# Patient Record
Sex: Female | Born: 1985 | Race: Black or African American | Hispanic: No | Marital: Single | Smoking: Former smoker
Health system: Southern US, Community
[De-identification: ages and names within clinical notes are randomized; demographics above are authoritative.]

## PROBLEM LIST (undated history)

## (undated) DIAGNOSIS — F329 Major depressive disorder, single episode, unspecified: Secondary | ICD-10-CM

## (undated) DIAGNOSIS — F32A Depression, unspecified: Secondary | ICD-10-CM

## (undated) HISTORY — DX: Depression, unspecified: F32.A

## (undated) HISTORY — DX: Major depressive disorder, single episode, unspecified: F32.9

---
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
## 2004-06-20 ENCOUNTER — Emergency Department (HOSPITAL_COMMUNITY): Admission: EM | Admit: 2004-06-20 | Discharge: 2004-06-20 | Payer: Self-pay | Admitting: Emergency Medicine

## 2004-07-30 ENCOUNTER — Ambulatory Visit: Payer: Self-pay | Admitting: Obstetrics and Gynecology

## 2005-02-21 ENCOUNTER — Inpatient Hospital Stay (HOSPITAL_COMMUNITY): Admission: AD | Admit: 2005-02-21 | Discharge: 2005-02-21 | Payer: Self-pay | Admitting: Obstetrics and Gynecology

## 2005-04-01 ENCOUNTER — Ambulatory Visit: Payer: Self-pay | Admitting: Obstetrics and Gynecology

## 2005-11-13 ENCOUNTER — Other Ambulatory Visit: Admission: RE | Admit: 2005-11-13 | Discharge: 2005-11-13 | Payer: Self-pay | Admitting: Gynecology

## 2006-05-31 ENCOUNTER — Inpatient Hospital Stay (HOSPITAL_COMMUNITY): Admission: AD | Admit: 2006-05-31 | Discharge: 2006-06-04 | Payer: Self-pay | Admitting: Gynecology

## 2006-05-31 ENCOUNTER — Ambulatory Visit: Payer: Self-pay | Admitting: Obstetrics & Gynecology

## 2006-06-01 ENCOUNTER — Encounter (INDEPENDENT_AMBULATORY_CARE_PROVIDER_SITE_OTHER): Payer: Self-pay | Admitting: *Deleted

## 2006-07-14 ENCOUNTER — Other Ambulatory Visit: Admission: RE | Admit: 2006-07-14 | Discharge: 2006-07-14 | Payer: Self-pay | Admitting: Gynecology

## 2007-07-23 ENCOUNTER — Other Ambulatory Visit: Admission: RE | Admit: 2007-07-23 | Discharge: 2007-07-23 | Payer: Self-pay | Admitting: Gynecology

## 2007-10-25 ENCOUNTER — Emergency Department (HOSPITAL_COMMUNITY): Admission: EM | Admit: 2007-10-25 | Discharge: 2007-10-26 | Payer: Self-pay | Admitting: Emergency Medicine

## 2008-03-14 ENCOUNTER — Ambulatory Visit: Payer: Self-pay | Admitting: Gynecology

## 2008-03-15 ENCOUNTER — Ambulatory Visit: Payer: Self-pay | Admitting: Gynecology

## 2008-09-08 ENCOUNTER — Inpatient Hospital Stay (HOSPITAL_COMMUNITY): Admission: AD | Admit: 2008-09-08 | Discharge: 2008-09-08 | Payer: Self-pay | Admitting: Obstetrics & Gynecology

## 2008-10-28 ENCOUNTER — Encounter (INDEPENDENT_AMBULATORY_CARE_PROVIDER_SITE_OTHER): Payer: Self-pay | Admitting: Obstetrics and Gynecology

## 2008-10-28 ENCOUNTER — Inpatient Hospital Stay (HOSPITAL_COMMUNITY): Admission: AD | Admit: 2008-10-28 | Discharge: 2008-10-31 | Payer: Self-pay | Admitting: Obstetrics and Gynecology

## 2010-06-14 ENCOUNTER — Emergency Department (HOSPITAL_COMMUNITY)
Admission: EM | Admit: 2010-06-14 | Discharge: 2010-06-14 | Payer: Self-pay | Source: Home / Self Care | Admitting: Family Medicine

## 2010-08-26 LAB — POCT URINALYSIS DIPSTICK
Bilirubin Urine: NEGATIVE
Glucose, UA: NEGATIVE mg/dL
Ketones, ur: 40 mg/dL — AB
Nitrite: NEGATIVE
Protein, ur: NEGATIVE mg/dL
Specific Gravity, Urine: 1.02 (ref 1.005–1.030)
Urobilinogen, UA: 1 mg/dL (ref 0.0–1.0)
pH: 6.5 (ref 5.0–8.0)

## 2010-08-26 LAB — POCT PREGNANCY, URINE: Preg Test, Ur: NEGATIVE

## 2010-09-24 LAB — CBC
HCT: 22.9 % — ABNORMAL LOW (ref 36.0–46.0)
HCT: 34.1 % — ABNORMAL LOW (ref 36.0–46.0)
Hemoglobin: 7.6 g/dL — CL (ref 12.0–15.0)
Hemoglobin: 11.1 g/dL — ABNORMAL LOW (ref 12.0–15.0)
MCV: 82.8 fL (ref 78.0–100.0)
MCV: 82.4 fL (ref 78.0–100.0)
Platelets: 176 10*3/uL (ref 150–400)
Platelets: 256 10*3/uL (ref 150–400)
RDW: 15.1 % (ref 11.5–15.5)
WBC: 11.5 10*3/uL — ABNORMAL HIGH (ref 4.0–10.5)
WBC: 9.4 10*3/uL (ref 4.0–10.5)

## 2010-09-26 LAB — URINALYSIS, ROUTINE W REFLEX MICROSCOPIC
Bilirubin Urine: NEGATIVE
Glucose, UA: NEGATIVE mg/dL
Hgb urine dipstick: NEGATIVE
Specific Gravity, Urine: >1.03 — ABNORMAL HIGH (ref 1.005–1.030)
Urobilinogen, UA: 0.2 mg/dL (ref 0.0–1.0)

## 2010-09-26 LAB — FETAL FIBRONECTIN: Fetal Fibronectin: NEGATIVE

## 2010-09-26 LAB — COMPREHENSIVE METABOLIC PANEL
ALT: 12 U/L (ref 0–35)
AST: 18 U/L (ref 0–37)
Alkaline Phosphatase: 103 U/L (ref 39–117)
CO2: 22 mEq/L (ref 19–32)
Chloride: 108 mEq/L (ref 96–112)
GFR calc Af Amer: >60 mL/min (ref >60)
GFR calc non Af Amer: >60 mL/min (ref >60)
Glucose, Bld: 94 mg/dL (ref 70–99)
Potassium: 3.3 mEq/L — ABNORMAL LOW (ref 3.5–5.1)
Sodium: 138 mEq/L (ref 135–145)
Total Bilirubin: 0.8 mg/dL (ref 0.3–1.2)

## 2010-10-29 NOTE — Op Note (Signed)
Dana Hart, SLIWA NO.:  [id number]   MEDICAL RECORD NO.:  [id number]          PATIENT TYPE:  INP   LOCATION:  9104                          [facility]   PHYSICIAN:  Malva Limes, M.D.    DATE OF BIRTH:  [birth year]   DATE OF PROCEDURE:  DATE OF DISCHARGE:                               OPERATIVE REPORT   PREOPERATIVE DIAGNOSES:  1. Intrauterine pregnancy at term.  2. History of prior cesarean section.  3. Attempted vaginal birth after cesarean section.  4. Failure to progress.   POSTOPERATIVE DIAGNOSES:  1. Intrauterine pregnancy at term.  2. History of prior cesarean section.  3. Attempted vaginal birth after cesarean section.  4. Failure to progress.  5. Dense adhesions.   PROCEDURE:  1. Repeat low transverse cesarean section.  2. Lysis of adhesions.   SURGEON:  Malva Limes, MD   ANESTHESIA:  Epidural.   ANTIBIOTICS:  Ancef 1 g.   DRAINS:  Foley bedside drainage.   ESTIMATED BLOOD LOSS:  900 mL.   COMPLICATIONS:  None.   SPECIMENS:  Placenta to pathology.   FINDINGS:  The patient had extensive adhesions between the uterus and  the anterior abdominal wall.  There was also omental adhesions to the  anterior abdominal wall.  The bladder was densely adherent to the  anterior abdominal wall also.   PROCEDURE:  The patient was taken to the operating room where her  epidural anesthetic was reinjected once an adequate level was reached.  The patient was prepped and draped in the usual fashion for this  procedure.  An incision was made through the previous scar.  This was  carried down to fascia.  The fascia was entered to midline, extended  laterally with Mayo scissors.  Rectus muscles were then separated from  the fascia with the Bovie.  Rectus muscle divided midline and taken  superiorly and inferiorly.  Abdominal cavity was entered near the  superior margin of the incision at which time there was noted be dense  adhesions involving the  uterus and anterior abdominal wall.  The rectus  muscles were completely separated and the bladder flap taken down.  There was 3-cm x 2-cm adhesion in the left mid body uterus to the  anterior abdominal wall, which was separated with Bovie.  Once the  entire uterus was separated from the abdominal wall, a low transverse  uterine incision was made in the midline and extended laterally.  Amniotic fluid was noted to be clear and entering the uterine cavity.  The infant was easily delivered in the vertex presentation.  On delivery  of the head, the oropharynx and nostrils were bulb suctioned.  The cord  doubly clamped and cut, and the infant handed to the waiting NICU team.  The placenta was manually removed.  The uterus was exteriorized.  The  uterine cavity was inspected and cleaned with a wet lap.  Uterine  incision was closed in a single layer of 0 Monocryl in a running locking  fashion.  The bladder flap was not closed.  The area where the uterus  was stuck to the anterior abdominal wall was closed using 0 Monocryl  suture in a running locking fashion.  The uterus was placed back in the  abdominal cavity.  Fallopian tubes and ovaries were noted to be normal.  Interceed was placed over the injured area on the uterus and also over  the low transverse uterine incision.  Parietal peritoneum and rectus  muscles were reapproximated to midline using 2-0 Monocryl in a running  fashion.  The fascia was closed using 0 Monocryl suture in running  fashion.  Subcuticular tissue was made hemostatic with a Bovie.  A  keloid scar was removed.  The skin was then closed using the staples.  The patient was taken to recovery room in stable condition.  Instrument  and lap counts were correct x2.           ______________________________  Malva Limes, M.D.     MA/MEDQ  D:  10/28/2008  T:  10/29/2008  Job:  [id number]

## 2010-11-01 NOTE — Group Therapy Note (Signed)
Dana Hart, RADEBAUGH NO.:  [id number]   MEDICAL RECORD NO.:  [id number]          PATIENT TYPE:  WOC   LOCATION:  [facility]                   FACILITY:  [facility]   PHYSICIAN:  Argentina Donovan, MD        DATE OF BIRTH:  18-Dec-1985   DATE OF SERVICE:  07/30/2004                                    CLINIC NOTE   The patient is a 25 year old gravida 1, para 0-0-1-0 who had normal periods  up until December.  In December of this past year, she had a normal period  followed five days later by an episode of heavy bleeding with passage of  clots.  That continued up until about January 5 when she went into [facility] at which time they checked her hemoglobin and examined her and  told her she had abnormal bleeding.  Treated her with some ibuprofen and  sent her home.  The bleeding stopped a day or so later and then  reestablished itself a week later which was probably at the time of a  regular period.  Since that time she has been asymptomatic.  In the past,  over a year ago, the patient had used Depo Provera and at the same time  because of abnormal bleeding was placed on oral contraceptives.  Other than  that she is not using anything.  She is a very slight black girl 5 feet 1  inch weighing 106 pounds with examination at [facility] which was  completely negative.   PHYSICAL EXAMINATION:  ABDOMEN:  Soft, flat, nontender.  No masses.  No  organomegaly.  PELVIC:  External genitalia is normal.  BUS within normal limits.  Vagina is  clean and well rugated.  Cervix is clean and nulliparous.  Uterus and adnexa  are normal.   IMPRESSION:  Dysfunctional uterine bleeding.  We discussed with the patient  that we would probably cycle her on birth control pills if this  reestablished itself.  However, since she has not had any bleeding now in  the past week or so I would observe and see what happens to her cycle.  In  addition, we asked the patient if she had any other  questions.  We discussed  deep thrusting dyspareunia and white discharge from the breast on  manipulation.  Other than that, the patient seems in good health, seems to  understand our rationale for not treating this at the present time.  She  does not need birth control, she says, at this time so we will hold off  using the oral contraceptives unless there is an onset of dysfunctional  uterine bleeding.      PR/MEDQ  D:  07/30/2004  T:  07/31/2004  Job:  [id number]

## 2010-11-01 NOTE — Discharge Summary (Signed)
Dana Hart, Dana Hart                ACCOUNT NO.:  [id number]   MEDICAL RECORD NO.:  [id number]          PATIENT TYPE:  INP   LOCATION:  9107                          [facility]   PHYSICIAN:  Juan H. Dana Hart, M.D.DATE OF BIRTH:  09/01/1985   DATE OF ADMISSION:  05/31/2006  DATE OF DISCHARGE:  06/04/2006                               DISCHARGE SUMMARY   Total days hospitalized were 3.   HISTORY:  The patient is a 25 year old gravida 2, para 0, AB 1 who was  admitted in early labor. She had light meconium-stained amniotic fluid.  Also had evidence of known reassuring fetal heart rate tracing with deep  variable deceleration with late components and evidence of fetal  tachycardia and was taken to the operating room for suspected  chorioamniotic. Maternal temperature had been 99.7. She had reached 4 to  5 cm, 90% effaced, -2 station. She underwent a primary lower uterine  segment transverse cesarean section by Dr. Gaetano Hawthorne. Dana Hart and  findings of meconium-thick amniotic fluid, OP presentation. There was a  nuchal cord x1. She delivered a female infant. Apgars were 9 and 9 with a  weight of 7 pounds 12 ounces, arterial cord pH of 7.30, and she had some  mild uterine atony and had received uterotonic agent to help with her  bleeding. She did well postoperatively. Her blood type was O positive.  She was rubella negative and was to receive the rubella vaccine upon  discharge. Her hemoglobin and hematocrit were 10.7 and 32.7, platelet  count 322,000. Her diet had been advanced, her Foley catheter  discontinued, and she had been advanced to a regular diet. Today, the  third postoperative day, she was up and ambulating, tolerating a regular  diet well and passing flatus. Her incision site was intact. Fundal  height ____________ and firm, and lochia was scant. She was ready to be  discharged home, after her staples were to be removed.   FINAL DIAGNOSES:  1. Term intrauterine pregnancy.  2. Meconium-stained amniotic fluid.  3. Nonreassuring fetal heart tracing.  4. Uterine atony.  5. Surgical anemia.   PROCEDURE PERFORMED:  Primary lower uterine segment transverse cesarean  section.   FINAL DISPOSITION AND FOLLOWUP:  The patient was discharged home on the  third postoperative day. She was up ambulating and tolerating a regular  diet well. Her staples were to be removed and incision with Steri-  Strips. She was given a prescription for Tylox to take 1 p.o. q.4-6h.  p.r.n. pain. She was instructed to return to the office in six weeks for  postpartum visit. An instruction sheet was provided, and she was also to  continue her prenatal vitamins and iron. She was to receive a rubella  vaccine upon discharge.      Juan H. Dana Hart, M.D.  Electronically Signed     JHF/MEDQ  D:  06/04/2006  T:  06/04/2006  Job:  [id number]

## 2010-11-01 NOTE — Op Note (Signed)
NAMETOSCA, PLETZ                ACCOUNT NO.:  [id number]   MEDICAL RECORD NO.:  [id number]          PATIENT TYPE:  INP   LOCATION:  9107                          [facility]   PHYSICIAN:  Juan H. Lily Peer, M.D.DATE OF BIRTH:  [birth year]   DATE OF PROCEDURE:  05/31/2006  DATE OF DISCHARGE:                               OPERATIVE REPORT   SURGEON:  Juan H. Lily Peer, M.D.   FIRST ASSISTANT:  Lesly Dukes, M.D.   INDICATIONS FOR OPERATION:  A 25 year old gravida 2, para 0, AB 1, term,  admitted early labor.  She had light meconium-stained fluid; and also  had evidence of nonreassuring fetal heart rate tracing with deep  variable decelerations, with light components, and evidence of  development of fetal tachycardia.  The patient was afebrile with a  temperature of 99.7.  Cervix 4-5 cm, 90% effaced, and minus 2 station.   PREOPERATIVE DIAGNOSIS:  1. Term intrauterine pregnancy.  2. Light meconium-stained amniotic fluid.  3. Nonreassuring fetal heart rate tracing (decelerations followed by      tachycardia).  4. Meconium-stained amniotic fluid.   POSTOPERATIVE DIAGNOSIS:  1. 1.  Term intrauterine pregnancy.  2. Uterine atony.   ANESTHESIA:  Epidural.   PROCEDURE PERFORMED:  Primary lower uterine segment transverse cesarean  section and DeLee suction nasopharyngeal area of the newborn.   FINDINGS:  Meconium thick amniotic fluids, OP presentation, nuchal cord  x1, viable female infant, Apgars of 9 and 9 with a weight of 7 pounds 12  ounces, arterial cord pH 7.30, normal maternal pelvic anatomy.   DESCRIPTION OF OPERATION:  After the patient was adequately counseled  she was taken to the operating room where she underwent redosing through  her epidural catheter Foley catheter was then placed.  The abdomen was  prepped and draped in the usual sterile fashion.  Then 1/4% Marcaine was  infiltrated into the planned Pfannenstiel incision site.  The incision  was carried  down through the skin, subcutaneous tissue, down to the  rectus fascia whereby a midline nick was made.  The fascia was incised  in a transverse fashion.  The midline raphe was entered.  The peritoneal  cavity was entered cautiously.  The bladder flap was established.   The lower uterine segment was incised in a transverse fashion.  Thick  meconium-stained amniotic fluid was noted.  The newborn's head was  delivered after the nuchal cord was reduced.  The nasopharyngeal area  was suctioned with the DeLee suction for approximately 5 mL of meconium-  stained fluid.  The newborn gave immediate cry.  After the cord was  doubly clamped and excised, the newborn was passed off to the  neonatologist who was in attendance who gave the above-mentioned  parameters.  After cord blood was obtained, the placenta was delivered  from its uterine cavity, and submitted to pathology for histological  evaluation.  The uterus was exteriorized.  The uterine cavity was  irrigated and the remaining products of conception were cleared.  Pitocin drip was started; and the patient received a gram of Ancef.  Due  to  some mild uterine atony, the patient received 0.2 mg of Methergine  IM.  She was normotensive.   The uterine transverse incision was closed in a double-layered closure.  The first was #0 Vicryl suture in a locking-stitch manner; the second in  an imbricating manner of similar suture material.  The uterus was then  placed back in the pelvic cavity.  The pelvic cavity was copiously  irrigated with normal saline solution.  After ascertaining adequate  hemostasis, closure was commenced.  The visceral peritoneum was not  closed but the rectus fascia was closed with a running locking stitch of  #0 Vicryl suture.  The subcutaneous bleeders were Bovie cauterized.  The  skin was reapproximated with skin clips followed by placement of  Xeroform gauze and 4 x 4 dressing.  The patient was transferred to the   recovery room with stable vital signs.  Blood loss from procedure was  500 mL, IV fluid 1000 mL of lactated Ringer.  Urine output 150 mL.      Juan H. Lily Peer, M.D.  Electronically Signed     JHF/MEDQ  D:  06/01/2006  T:  06/01/2006  Job:  [id number]

## 2010-11-01 NOTE — Discharge Summary (Signed)
Dana Hart, MULHALL NO.:  [id number]   MEDICAL RECORD NO.:  [id number]          PATIENT TYPE:  INP   LOCATION:  9104                          [facility]   PHYSICIAN:  Ilda Mori, M.D.   DATE OF BIRTH:  12-Jun-1986   DATE OF ADMISSION:  10/28/2008  DATE OF DISCHARGE:  10/31/2008                               DISCHARGE SUMMARY   FINAL DIAGNOSES:  1. Intrauterine pregnancy at 39-1/2 weeks' gestation.  2. History of prior cesarean section.  3. Attempted vaginal birth after cesarean section.  4. Failure to progress.  5. Dense adhesions.   PROCEDURE:  Repeat low transverse cesarean section and lysis of  adhesions.   SURGEON:  Malva Limes, MD   COMPLICATIONS:  None.   This is a 25 year old G3, P 1-0-2-1 presents at 39-1/[redacted] weeks gestation  in labor.  The patient's antepartum course had been uncomplicated.  She  did have a history of a cesarean section with her last pregnancy and  desires a trial of vaginal birth after cesarean with this pregnancy.  She is negative for group B strep.  The patient is admitted in labor.  Amniotomy was performed and IUPCs were placed.  The patient had no  change in her cervix over a 5-6 hour period.  Now, starting develop a  low-grade temperature.  At this point, a discussion was held with the  patient regarding her failure to progress and a decision was made to  proceed with a cesarean section.  The patient was taken to the operating  room on Oct 28, 2008, by Dr. Malva Limes, where a repeat low  transverse cesarean section was performed with a delivery of a 5 pounds  10 ounces female infant with Apgars of 3, 7, and 9.  She was well-  suctioned and then vigorously stimulated and was stable and taken to the  [place].  Mother tolerated the procedure well.  The patient's  postoperative course was complicated by some postoperative anemia.  The  patient was started on iron during her hospital course and by  postoperative day #3, she was felt ready for discharge.  She was sent  home on a regular diet, told to decrease activities, told to continue  her prenatal vitamins, and an iron supplement daily was given, told she  could use over-the-counter ibuprofen up to 600 mg every 6 hours as  needed for pain, was also given a prescription for Tylox #20 one every 4-  6 hours as needed for pain, was given Depo-Provera 150 mg IM for  contraception and was to follow up in our office in 4 weeks.  Instructions and precautions were reviewed with the patient.   LABORATORY DATA ON DISCHARGE:  The patient had a hemoglobin of 7.8,  which was down from a preoperative level of 11.1, white blood cell count  of 11.5, and platelets of 176,000.      [name], P.A.-C.      Ilda Mori, M.D.  Electronically Signed    MB/MEDQ  D:  11/21/2008  T:  11/22/2008  Job:  [id number]

## 2011-02-10 ENCOUNTER — Ambulatory Visit: Payer: Self-pay | Admitting: Internal Medicine

## 2011-02-10 DIAGNOSIS — Z029 Encounter for administrative examinations, unspecified: Secondary | ICD-10-CM

## 2011-06-13 ENCOUNTER — Encounter: Payer: Self-pay | Admitting: Family Medicine

## 2011-06-13 ENCOUNTER — Ambulatory Visit (INDEPENDENT_AMBULATORY_CARE_PROVIDER_SITE_OTHER): Payer: Self-pay | Admitting: Family Medicine

## 2011-06-13 VITALS — BP 137/90 | HR 84 | Temp 98.4°F | Ht 61.5 in | Wt 106.0 lb

## 2011-06-13 DIAGNOSIS — F32A Depression, unspecified: Secondary | ICD-10-CM | POA: Insufficient documentation

## 2011-06-13 DIAGNOSIS — F329 Major depressive disorder, single episode, unspecified: Secondary | ICD-10-CM

## 2011-06-13 DIAGNOSIS — R61 Generalized hyperhidrosis: Secondary | ICD-10-CM | POA: Insufficient documentation

## 2011-06-13 DIAGNOSIS — Z0000 Encounter for general adult medical examination without abnormal findings: Secondary | ICD-10-CM

## 2011-06-13 MED ORDER — FLUOXETINE HCL 20 MG PO TABS: 20.0000 mg | ORAL_TABLET | Freq: Every day | ORAL | Status: DC

## 2011-06-13 NOTE — Patient Instructions (Signed)
It was great meeting you today! As far as your depression goes, I can start you on an antidepressant that should help.  I also recommend that you meet with our psychologist, Dr. Spero Geralds, for help dealing with your depression.  You can schedule an appointment with her by calling her directly at [phone]. About your night sweats, I would like to check a thyroid lab to make sure that your night sweats are not related to your thyroid. It could also be that the depo shot is causing this.  I'd like to see you back in 3 weeks to check and see how the medication is working for you.

## 2011-06-14 ENCOUNTER — Encounter: Payer: Self-pay | Admitting: Family Medicine

## 2011-06-14 DIAGNOSIS — Z0000 Encounter for general adult medical examination without abnormal findings: Secondary | ICD-10-CM | POA: Insufficient documentation

## 2011-06-14 NOTE — Assessment & Plan Note (Signed)
Night sweats could be hot flashes secondary to the depo shot. However, given fullness of neck on exam as well as family history of thyroid disorders, will obtain TSH. Patient does not have insurance and will be getting orange card. Patient to get TSH done when she gets orange card.

## 2011-06-14 NOTE — Progress Notes (Signed)
Patient ID: Dana Hart, female   DOB: [birth year], 25 y.o.   MRN: [id number] Patient ID: Dana Hart    DOB: January 11, 1986, 25 y.o.   MRN: [id number] --- Subjective:  Dana Hart is a 25 y.o.female who presents to establish care. Her main complaint is that she has has been having night sweats on a regular basis for about 2 years now. She says that she has been worked up for it [facility] and that everything was normal. She notes that it started shortly after having her last baby. She reports that she keeps her house cool at night. She denies any recent weight loss although she states that it is difficult for her to gain any weight. She denies any fever or cough or difficulty breathing.  Going over her medical history with her, she mentions that she has depression with which she was diagnosed when she was seeing a psychologist: [name] at [facility]. She had to quit seeing her in August due to having lost her insurance after loosing her job. The psychologist at the time had talked about starting her on medications. She admits to sleeping more recently, to not having any appetite. She reports loss of interest. She denies any suicidal ideations. She is not sure what keeps her going. She states that she thinks it's probably her kids although she lashes out at them. She also reports some times of not needing much sleep. She has a brother with schizoaffective disorder and both her father and mother have anxiety and depression.  Denies any  illicit drug use, drinks alcohol occasionally.  PHQ9: 21 with scores of 3 for questions 1,2,3,5,7,8 and scores of 1 for question 4 and score of 0 for thoughts that you would be better off dead or of hurting yourself in some way. Level of difficulty: somewhat difficult MDQ: answered yes to more than 7 answers in question 1 and answered yes for question 2 and serious problem for question 3.   Review of Systems:  Negative  except per history of present illness Objective: Filed Vitals:   06/13/11 1002  BP: 137/90  Pulse: 84  Temp: 98.4 F (36.9 C)    Physical Examination:   General appearance - alert, well appearing, and in no distress Nose - normal and patent, no erythema, discharge or polyps Mouth - mucous membranes moist, pharynx normal without lesions Neck - supple, mildly enlarged bilaterally, no nodules or lymphadenopathy noted Chest - clear to auscultation, no wheezes, rales or rhonchi, symmetric air entry Heart - normal rate, regular rhythm, normal S1, S2, no murmurs, rubs, clicks or gallops Abdomen - soft, nontender, nondistended, no masses or organomegaly Extremities - peripheral pulses normal, no pedal edema, no clubbing or cyanosis Mental Status: normal affect, good eye contact and normal speech. Mood: 7/10 (10 being great) although reports 3/10 a couple days ago.

## 2011-06-14 NOTE — Assessment & Plan Note (Signed)
Patient's [facility] and was normal.  Recommended that patient get flu shot at pharmacy.

## 2011-06-14 NOTE — Assessment & Plan Note (Addendum)
Patient mostly describes mood consisted with depressive disorder however after reviewing the MDQ she might have a component of mania. Prescribed fluoxetine 20mg  for depression but will reassess when patient follows up in 2-3 weeks. Will refer patient to Dr. Pascal Lux as patient is interested in counseling. Will also consult with Dr. Pascal Lux as patient may benefit from being seen at the mood disorder clinic.

## 2011-06-24 ENCOUNTER — Telehealth: Payer: Self-pay | Admitting: Family Medicine

## 2011-06-24 NOTE — Telephone Encounter (Signed)
Called patient to check in on how she was tolerating the fluoexetine. She had not filled it because [facility] was going to be charging her a higher price than [facility]. Since patient had a high score on her MDQ, I recommended that she not fill the medication just yet until we get a better working diagnosis. She agreed to this and has an appointment to follow up in 1 week or so.

## 2011-07-11 ENCOUNTER — Ambulatory Visit: Payer: Self-pay | Admitting: Family Medicine

## 2011-08-01 ENCOUNTER — Ambulatory Visit: Payer: Self-pay | Admitting: Family Medicine

## 2011-10-08 ENCOUNTER — Encounter: Payer: Self-pay | Admitting: *Deleted

## 2011-10-08 NOTE — Telephone Encounter (Signed)
This encounter was created in error - please disregard.

## 2011-10-08 NOTE — Telephone Encounter (Signed)
Message copied by Jose Persia on Wed Oct 08, 2011  9:31 AM ------      Message from: Marena Chancy E      Created: Thu Aug 21, 2011  4:42 PM      Regarding: SDA for Jonisha Kindig and Shantia Montaque       Hi Wainaku,      I'm sorry I haven't replied sooner than this about Dana Hart and Dana Hart. I have never met [facility] before and would feel fine with making her SDA. For Dana Hart, I have met her once before and she is really good at bringing her kids in. I wonder if she misses her own appointment because she wants to avoid talking about certain issues. I was wondering if we could give her one more warning and then make her a same day after that?      Thank you so much! Hope all is well,      Judeth Cornfield

## 2013-03-28 ENCOUNTER — Ambulatory Visit (INDEPENDENT_AMBULATORY_CARE_PROVIDER_SITE_OTHER): Payer: BC Managed Care – PPO | Admitting: Family Medicine

## 2013-03-28 ENCOUNTER — Encounter: Payer: Self-pay | Admitting: Family Medicine

## 2013-03-28 VITALS — BP 115/62 | HR 83 | Temp 98.6°F | Ht 62.0 in | Wt 114.0 lb

## 2013-03-28 DIAGNOSIS — F32A Depression, unspecified: Secondary | ICD-10-CM

## 2013-03-28 DIAGNOSIS — F329 Major depressive disorder, single episode, unspecified: Secondary | ICD-10-CM

## 2013-03-28 DIAGNOSIS — B353 Tinea pedis: Secondary | ICD-10-CM

## 2013-03-28 DIAGNOSIS — F3289 Other specified depressive episodes: Secondary | ICD-10-CM

## 2013-03-28 MED ORDER — TERBINAFINE HCL 250 MG PO TABS: 250.0000 mg | ORAL_TABLET | Freq: Every day | ORAL | Status: DC

## 2013-03-28 MED ORDER — QUETIAPINE FUMARATE 100 MG PO TABS: 100.0000 mg | ORAL_TABLET | Freq: Every day | ORAL | Status: DC

## 2013-03-28 NOTE — Patient Instructions (Signed)
For the mood, I think you may have a component of bipolar depression.  I recommend you call our psychologist, Dr. Pascal Lux to be evaluated at the mood clinic. You can schedule an appointment with her by calling her directly at [phone].  Start taking seroquel as follows:  50 mg once daily at bedtime on day 1; increase to 100 mg once daily on day 2, further increase by 100 mg daily each day until 300 mg once daily is reached by day 4  For the athlete's foot, I am sending an oral medicine to take daily for 3-4 weeks.   Athlete's Foot Athlete's foot (tinea pedis) is a fungal infection of the skin on the feet. It often occurs on the skin between the toes or underneath the toes. It can also occur on the soles of the feet. Athlete's foot is more likely to occur in hot, humid weather. Not washing your feet or changing your socks often enough can contribute to athlete's foot. The infection can spread from person to person (contagious). CAUSES Athlete's foot is caused by a fungus. This fungus thrives in warm, moist places. Most people get athlete's foot by sharing shower stalls, towels, and wet floors with an infected person. People with weakened immune systems, including those with diabetes, may be more likely to get athlete's foot. SYMPTOMS   Itchy areas between the toes or on the soles of the feet.  White, flaky, or scaly areas between the toes or on the soles of the feet.  Tiny, intensely itchy blisters between the toes or on the soles of the feet.  Tiny cuts on the skin. These cuts can develop a bacterial infection.  Thick or discolored toenails. DIAGNOSIS  Your caregiver can usually tell what the problem is by doing a physical exam. Your caregiver may also take a skin sample from the rash area. The skin sample may be examined under a microscope, or it may be tested to see if fungus will grow in the sample. A sample may also be taken from your toenail for testing. TREATMENT  Over-the-counter and  prescription medicines can be used to kill the fungus. These medicines are available as powders or creams. Your caregiver can suggest medicines for you. Fungal infections respond slowly to treatment. You may need to continue using your medicine for several weeks. PREVENTION   Do not share towels.  Wear sandals in wet areas, such as shared locker rooms and shared showers.  Keep your feet dry. Wear shoes that allow air to circulate. Wear cotton or wool socks. HOME CARE INSTRUCTIONS   Take medicines as directed by your caregiver. Do not use steroid creams on athlete's foot.  Keep your feet clean and cool. Wash your feet daily and dry them thoroughly, especially between your toes.  Change your socks every day. Wear cotton or wool socks. In hot climates, you may need to change your socks 2 to 3 times per day.  Wear sandals or canvas tennis shoes with good air circulation.  If you have blisters, soak your feet in Burow's solution or Epsom salts for 20 to 30 minutes, 2 times a day to dry out the blisters. Make sure you dry your feet thoroughly afterward. SEEK MEDICAL CARE IF:   You have a fever.  You have swelling, soreness, warmth, or redness in your foot.  You are not getting better after 7 days of treatment.  You are not completely cured after 30 days.  You have any problems caused by your medicines.  MAKE SURE YOU:   Understand these instructions.  Will watch your condition.  Will get help right away if you are not doing well or get worse. Document Released: 05/30/2000 Document Revised: 08/25/2011 Document Reviewed: 03/21/2011 [facility] Patient Information 2014 [place], [place].

## 2013-03-29 LAB — COMPREHENSIVE METABOLIC PANEL
ALT: 9 U/L (ref 0–35)
AST: 14 U/L (ref 0–37)
BUN: 8 mg/dL (ref 6–23)
Calcium: 9.8 mg/dL (ref 8.4–10.5)
Creat: 0.62 mg/dL (ref 0.50–1.10)
Total Bilirubin: 0.5 mg/dL (ref 0.3–1.2)

## 2013-03-29 LAB — CBC
HCT: 40.1 % (ref 36.0–46.0)
Hemoglobin: 12.7 g/dL (ref 12.0–15.0)
MCHC: 31.7 g/dL (ref 30.0–36.0)
MCV: 79.9 fL (ref 78.0–100.0)
RDW: 15.2 % (ref 11.5–15.5)

## 2013-03-30 DIAGNOSIS — B353 Tinea pedis: Secondary | ICD-10-CM | POA: Insufficient documentation

## 2013-03-30 NOTE — Assessment & Plan Note (Signed)
Appears to be a component of bipolar depression given h/o spending and sleepless nights as well as positive family history.  - will start seroquel  - highly recommended that she get in touch with Dr. Pascal Lux to see if she can be seen at the mood clinic for diagnosis and treatment.

## 2013-03-30 NOTE — Progress Notes (Signed)
Patient ID: Dana Hart    DOB: 08/16/1985, 27 y.o.   MRN: [id number] --- Subjective:  Dana Hart is a 27 y.o.female who presents to clinic for evaluation of depression as well as foot concern.  - depression: has been a chronic problem for a few years but has become worst in the last 4 months. She can't sleep, she has crying spells, she has loss of appetite. She denies any SI/HI.  She had been prescribed Prozac last year which she did not take. She has a history of uncontrollable spending, she also has a history of not sleeping at night and not feeling like she needs the sleep. She has a brother with bipolar disorder. She denies any illicit drug use or alcohol use. She doesn't currently smoke.   - foot itching and white peeling in between toes. Both feet. Started on right foot, now left as well. She has tried multiple creams and powders which did not work. She washes her feet twice a day. This has been a problem since a teenager.   ROS: see HPI Past Medical History: reviewed and updated medications and allergies. Social History: Tobacco: none  Objective: Filed Vitals:   03/28/13 1539  BP: 115/62  Pulse: 83  Temp: 98.6 F (37 C)    Physical Examination:   General appearance - alert, well appearing, and in no distress Foot exam - white patches in between toes bilaterally, thickened and dry skin on plantar aspect of feet and heels

## 2013-03-30 NOTE — Assessment & Plan Note (Signed)
Chronic problem which has not responded to creams in the past. Clinically looks like tinea pedis.  - treat with terbinafine for 3 weeks.  - CMP obtained to make sure normal LFT's.

## 2013-03-31 ENCOUNTER — Telehealth: Payer: Self-pay | Admitting: *Deleted

## 2013-03-31 NOTE — Telephone Encounter (Signed)
Message copied by Arlyss Repress on Thu Mar 31, 2013  9:31 AM ------      Message from: Marena Chancy E      Created: Wed Mar 30, 2013  7:42 PM       Hi Wai Minotti,      Would you have time to call Ms. Antrobus to let her know that her labwork was normal and that she can start taking the antifungal medicine?       Thank you so much!      Judeth Cornfield ------

## 2013-03-31 NOTE — Telephone Encounter (Signed)
Called pt.informed. .Dana Hart  

## 2013-04-08 ENCOUNTER — Telehealth: Payer: Self-pay | Admitting: Family Medicine

## 2013-04-08 NOTE — Telephone Encounter (Signed)
Patient left FMLA papers to be filled out.  Please fax when completed.

## 2013-04-11 NOTE — Telephone Encounter (Signed)
Daughter of Tamera Stands

## 2013-04-21 ENCOUNTER — Encounter: Payer: Self-pay | Admitting: Family Medicine

## 2013-04-21 ENCOUNTER — Ambulatory Visit (INDEPENDENT_AMBULATORY_CARE_PROVIDER_SITE_OTHER): Payer: BC Managed Care – PPO | Admitting: Family Medicine

## 2013-04-21 VITALS — BP 130/80 | HR 82 | Ht 62.0 in | Wt 113.4 lb

## 2013-04-21 DIAGNOSIS — B353 Tinea pedis: Secondary | ICD-10-CM

## 2013-04-21 DIAGNOSIS — F3181 Bipolar II disorder: Secondary | ICD-10-CM

## 2013-04-21 DIAGNOSIS — F3189 Other bipolar disorder: Secondary | ICD-10-CM

## 2013-04-21 MED ORDER — TERBINAFINE HCL 250 MG PO TABS: 250.0000 mg | ORAL_TABLET | Freq: Every day | ORAL | Status: DC

## 2013-04-21 NOTE — Progress Notes (Signed)
Patient ID: Dana Hart    DOB: September 29, 1985, 27 y.o.   MRN: [id number] --- Subjective:  Dana Hart is a 27 y.o.female who presents for follow up on mood: - Was started on seroquel originally. Through work, she was able to find a therapist who then referred her to a psychiatrist at the [facility]: Dr. Ezzard Flax. She was diagnosed with bipolar 2. Seroquel was stopped and she was started on lamictal 25mg  and cloazepam 0.5mg  bid. She has been on it for 4-5 days. She reports persistent episodes of loosing her temper. Decreased sleep. Sad mood and difficulty functioning at work.   - tinea pedis: has been on terbinafine for 3 weeks. Take med daily. Has noticed some improvement but not completely resolved.    ROS: see HPI Past Medical History: reviewed and updated medications and allergies. Social History: Tobacco: former smoker  Objective: Filed Vitals:   04/21/13 1510  BP: 130/80  Pulse: 82    Physical Examination:   General appearance - alert, well appearing, and in no distress Left foot - white flaking patches in between toes.

## 2013-04-21 NOTE — Patient Instructions (Signed)
I think it is a good idea to follow up with the psychiatrist and let her know how the medications are affecting you.   Bipolar Disorder Bipolar disorder is a mental illness. The term bipolar disorder actually is used to describe a group of disorders that all share varying degrees of emotional highs and lows that can interfere with daily functioning, such as work, school, or relationships. Bipolar disorder also can lead to drug abuse, hospitalization, and suicide. The emotional highs of bipolar disorder are periods of elation or irritability and high energy. These highs can range from a mild form (hypomania) to a severe form (mania). People experiencing episodes of hypomania may appear energetic, excitable, and highly productive. People experiencing mania may behave impulsively or erratically. They often make poor decisions. They may have difficulty sleeping. The most severe episodes of mania can involve having very distorted beliefs or perceptions about the world and seeing or hearing things that are not real (psychotic delusions and hallucinations).  The emotional lows of bipolar disorder (depression) also can range from mild to severe. Severe episodes of bipolar depression can involve psychotic delusions and hallucinations. Sometimes people with bipolar disorder experience a state of mixed mood. Symptoms of hypomania or mania and depression are both present during this mixed-mood episode. SIGNS AND SYMPTOMS There are signs and symptoms of the episodes of hypomania and mania as well as the episodes of depression. The signs and symptoms of hypomania and mania are similar but vary in severity. They include:  Inflated self-esteem or feeling of increased self-confidence.  Decreased need for sleep.  Unusual talkativeness (rapid or pressured speech) or the feeling of a need to keep talking.  Sensation of racing thoughts or constant talking, with quick shifts between topics that may or may not be related  (flight of ideas).  Decreased ability to focus or concentrate.  Increased purposeful activity, such as work, studies, or social activity, or nonproductive activity, such as pacing, squirming and fidgeting, or finger and toe tapping.  Impulsive behavior and use of poor judgment, resulting in high-risk activities, such as having unprotected sex or spending excessive amounts of money. Signs and symptoms of depression include the following:   Feelings of sadness, hopelessness, or helplessness.  Frequent or uncontrollable episodes of crying.  Lack of feeling anything or caring about anything.  Difficulty sleeping or sleeping too much.  Inability to enjoy the things you used to enjoy.   Desire to be alone all the time.   Feelings of guilt or worthlessness.  Lack of energy or motivation.   Difficulty concentrating, remembering, or making decisions.  Change in appetite or weight beyond normal fluctuations.  Thoughts of death or the desire to harm yourself. DIAGNOSIS  Bipolar disorder is diagnosed through an assessment by your caregiver. Your caregiver will ask questions about your emotional episodes. There are two main types of bipolar disorder. People with type I bipolar disorder have manic episodes with or without depressive episodes. People with type II bipolar disorder have hypomanic episodes and major depressive episodes, which are more serious than mild depression. The type of bipolar disorder you have can make an important difference in how your illness is monitored and treated. Your caregiver may ask questions about your medical history and use of alcohol or drugs, including prescription medication. Certain medical conditions and substances also can cause emotional highs and lows that resemble bipolar disorder (secondary bipolar disorder).  TREATMENT  Bipolar disorder is a long-term illness. It is best controlled with continuous treatment rather  than treatment only when  symptoms occur. The following treatments can be prescribed for bipolar disorders:  Medication Medication can be prescribed by a doctor that is an expert in treating mental disorders (psychiatrists). Medications called mood stabilizers are usually prescribed to help control the illness. Other medications are sometimes added if symptoms of mania, depression, or psychotic delusions and hallucinations occur despite the use of a mood stabilizer.  Talk therapy Some forms of talk therapy are helpful in providing support, education, and guidance. A combination of medication and talk therapy is best for managing the disorder over time. A procedure in which electricity is applied to your brain through your scalp (electroconvulsive therapy) is used in cases of severe mania when medication and talk therapy do not work or work too slowly. Document Released: 09/08/2000 Document Revised: 09/27/2012 Document Reviewed: 06/28/2012 [facility] Patient Information 2014 [place], [place].

## 2013-04-25 DIAGNOSIS — F3181 Bipolar II disorder: Secondary | ICD-10-CM | POA: Insufficient documentation

## 2013-04-25 NOTE — Assessment & Plan Note (Addendum)
Recently evaluated and diagnosed by psychiatrist.  Currently on lamictal which has not yet been titrated up. Also on clonazepam. Symptoms not yet controled. Encouraged patient to follow up with her psychiatrist, Dr. Mila Homer and adjust medications. She has an appointment in 1 week with psychiatrist.

## 2013-04-25 NOTE — Assessment & Plan Note (Signed)
Not completely resolved. Continue terbinafine for another 3 weeks.

## 2013-04-29 ENCOUNTER — Telehealth: Payer: Self-pay | Admitting: Family Medicine

## 2013-04-29 NOTE — Telephone Encounter (Signed)
Calling regarding her psychological issues.  Wanting to have FMLA papers completed for STD.

## 2013-04-29 NOTE — Telephone Encounter (Signed)
Called patient back and spoke with her. She tells me that her mother is in the ED right now. Patient is feeling on the verge of a nervous breakdown and doesn't think she is going to be able to make it at work for the next few days until she gets her bipolar under control. I told her that I would sign FMLA paperwork if she sends me the paperwork. Patient agreed to call her employer and have them fax me the forms. I will send them back early next week.  She also asked me if there was anythign she could take to help her nerves right now. She is on lamictal and clonazepam which do not appear to be helping. Recommended that she contact her psychiatrist for further recommendations. She denies any SI/HI at this time. I told her that if she did start having SI to let the doctors at the emergency room know.  She expressed understanding and agreed with plan.   Marena Chancy, PGY-3 Family Medicine Resident

## 2013-05-09 ENCOUNTER — Telehealth: Payer: Self-pay | Admitting: Family Medicine

## 2013-05-09 NOTE — Telephone Encounter (Signed)
Filled out an FMLA form and an attending physician statement for Dana HartDana Hart to get 1 month of leave to allow titration of medications to treat her bipolar 2 which is currently not controled.   Will scan documents in chart.   Dana Hart, PGY-3 Family Medicine Resident

## 2013-08-17 ENCOUNTER — Encounter: Payer: BC Managed Care – PPO | Admitting: Family Medicine

## 2013-09-21 ENCOUNTER — Ambulatory Visit (INDEPENDENT_AMBULATORY_CARE_PROVIDER_SITE_OTHER): Payer: BC Managed Care – PPO | Admitting: Family Medicine

## 2013-09-21 ENCOUNTER — Encounter: Payer: Self-pay | Admitting: Family Medicine

## 2013-09-21 ENCOUNTER — Other Ambulatory Visit (HOSPITAL_COMMUNITY)
Admission: RE | Admit: 2013-09-21 | Discharge: 2013-09-21 | Disposition: A | Discharge status: Home / Self Care | Payer: BC Managed Care – PPO | Source: Ambulatory Visit | Attending: Family Medicine | Admitting: Family Medicine

## 2013-09-21 VITALS — BP 125/83 | HR 83 | Temp 98.6°F | Ht 61.0 in | Wt 105.8 lb

## 2013-09-21 DIAGNOSIS — F3189 Other bipolar disorder: Secondary | ICD-10-CM

## 2013-09-21 DIAGNOSIS — Z124 Encounter for screening for malignant neoplasm of cervix: Secondary | ICD-10-CM

## 2013-09-21 DIAGNOSIS — Z01419 Encounter for gynecological examination (general) (routine) without abnormal findings: Secondary | ICD-10-CM | POA: Insufficient documentation

## 2013-09-21 DIAGNOSIS — N898 Other specified noninflammatory disorders of vagina: Secondary | ICD-10-CM

## 2013-09-21 DIAGNOSIS — Z0000 Encounter for general adult medical examination without abnormal findings: Secondary | ICD-10-CM

## 2013-09-21 DIAGNOSIS — F17200 Nicotine dependence, unspecified, uncomplicated: Secondary | ICD-10-CM

## 2013-09-21 DIAGNOSIS — Z113 Encounter for screening for infections with a predominantly sexual mode of transmission: Secondary | ICD-10-CM | POA: Insufficient documentation

## 2013-09-21 DIAGNOSIS — F3181 Bipolar II disorder: Secondary | ICD-10-CM

## 2013-09-21 LAB — LIPID PANEL
Cholesterol: 134 mg/dL (ref 0–200)
HDL: 38 mg/dL — AB (ref >39)
LDL CALC: 79 mg/dL (ref 0–99)
TRIGLYCERIDES: 86 mg/dL (ref <150)
Total CHOL/HDL Ratio: 3.5 Ratio
VLDL: 17 mg/dL (ref 0–40)

## 2013-09-21 LAB — POCT WET PREP (WET MOUNT): Clue Cells Wet Prep Whiff POC: NEGATIVE

## 2013-09-21 LAB — RPR

## 2013-09-21 MED ORDER — METRONIDAZOLE 0.75 % VA GEL: 1.0000 | Freq: Two times a day (BID) | VAGINAL | Status: DC

## 2013-09-21 NOTE — Progress Notes (Signed)
Subjective:     Dana Hart is a 28 y.o. female here for a routine exam.  Current complaints:  Increased anxiety and stress. She was on lamictal for her bipolar until February but stopped taking it because she was not able to concentrate on things at work while she was taking it. She has been having difficulty sleeping, eating, concentrating. Denies SI/HI.   - also reports some whitish vaginal discharge for two weeks with a slight odor. No itching or burning. No abdominal pain.    Gynecologic History No LMP recorded. Patient is not currently having periods (Reason: IUD). Contraception: IUD Last Pap: unsure. Results were: normal Last mammogram: none.   Family History: maternal great grandmother with breast cancer and second cousin with ovarian cancer. No colon cancer Obstetric History OB History  No data available    Review of Systems Pertinent items are noted in HPI.    Objective:    BP 125/83  Pulse 83  Temp(Src) 98.6 F (37 C) (Oral)  Ht 5\' 1"  (1.549 m)  Wt 105 lb 12.8 oz (47.991 kg)  BMI 20.00 kg/m2  Physical Examination: General appearance - alert, well appearing, and in no distress Eyes - pupils equal and reactive, extraocular eye movements intact Ears - bilateral TM's and external ear canals normal Nose - normal and patent, no erythema, discharge or polyps Mouth - mucous membranes moist, pharynx normal without lesions Neck - supple, no significant adenopathy Chest - clear to auscultation, no wheezes, rales or rhonchi, symmetric air entry Heart - normal rate, regular rhythm, normal S1, S2, no murmurs, rubs, clicks or gallops Abdomen - soft, nontender, nondistended, no masses or organomegaly Breasts - breasts appear normal, no suspicious masses, no skin or nipple changes or axillary nodes Extremities - peripheral pulses normal, no pedal edema, no clubbing or cyanosis   Assessment:    Healthy female exam.    Plan:   1. Well woman exam: - PAP smear today -  wet prep for vaginal discharge - patient wanting std screen. Will check GC/Chl, HIV and RPR - will also obtain baseline lipid panel.   2. Bipolar: patient has appointment with her psychiatrist next week. Recommended to keep appt.  3. Smoking: started 1 month ago. Likely related to life stressors. Smoking 1/2pack per day.  - gave her info about quit line and patient can call clinic if she would like any other support.   Marena ChancyStephanie Pebble Botkin, PGY-3 Family Medicine Resident

## 2013-09-21 NOTE — Patient Instructions (Signed)
We will be in touch with the results.

## 2013-09-21 NOTE — Assessment & Plan Note (Addendum)
started 1 month ago. Likely related to life stressors. Smoking 1/2pack per day.  - gave her info about quit line and patient can call clinic if she would like any other support.

## 2013-09-22 ENCOUNTER — Telehealth: Payer: Self-pay | Admitting: Family Medicine

## 2013-09-22 LAB — CERVICOVAGINAL ANCILLARY ONLY
CHLAMYDIA, DNA PROBE: NEGATIVE
Neisseria Gonorrhea: NEGATIVE

## 2013-09-22 LAB — HIV ANTIBODY (ROUTINE TESTING W REFLEX): HIV 1&2 Ab, 4th Generation: NONREACTIVE

## 2013-09-22 NOTE — Assessment & Plan Note (Signed)
patient has appointment with her psychiatrist next week. Recommended to keep appt.

## 2013-09-22 NOTE — Telephone Encounter (Signed)
Please let patient know that GC/Chl, HIV and RPR were all negative and normal.   Thank you!  Marena ChancyStephanie Tinita Brooker, PGY-3 Family Medicine Resident

## 2013-09-23 ENCOUNTER — Encounter: Payer: Self-pay | Admitting: Family Medicine

## 2013-09-23 NOTE — Telephone Encounter (Signed)
Patient informed, expressed understanding. 

## 2013-10-05 ENCOUNTER — Ambulatory Visit (INDEPENDENT_AMBULATORY_CARE_PROVIDER_SITE_OTHER): Payer: BC Managed Care – PPO | Admitting: Family Medicine

## 2013-10-05 ENCOUNTER — Encounter: Payer: Self-pay | Admitting: Family Medicine

## 2013-10-05 VITALS — BP 111/74 | HR 74 | Temp 98.8°F | Ht 61.0 in | Wt 107.6 lb

## 2013-10-05 DIAGNOSIS — F3181 Bipolar II disorder: Secondary | ICD-10-CM

## 2013-10-05 DIAGNOSIS — N949 Unspecified condition associated with female genital organs and menstrual cycle: Secondary | ICD-10-CM

## 2013-10-05 DIAGNOSIS — R102 Pelvic and perineal pain: Secondary | ICD-10-CM

## 2013-10-05 DIAGNOSIS — F3189 Other bipolar disorder: Secondary | ICD-10-CM

## 2013-10-05 MED ORDER — FLUCONAZOLE 150 MG PO TABS: 150.0000 mg | ORAL_TABLET | Freq: Once | ORAL | Status: DC

## 2013-10-05 NOTE — Patient Instructions (Signed)
We are going to treat you with diflucan for a yeast infection.   If things don't get better, please return to care.   Bacterial Vaginosis Bacterial vaginosis is a vaginal infection that occurs when the normal balance of bacteria in the vagina is disrupted. It results from an overgrowth of certain bacteria. This is the most common vaginal infection in women of childbearing age. Treatment is important to prevent complications, especially in pregnant women, as it can cause a premature delivery. CAUSES  Bacterial vaginosis is caused by an increase in harmful bacteria that are normally present in smaller amounts in the vagina. Several different kinds of bacteria can cause bacterial vaginosis. However, the reason that the condition develops is not fully understood. RISK FACTORS Certain activities or behaviors can put you at an increased risk of developing bacterial vaginosis, including:  Having a new sex partner or multiple sex partners.  Douching.  Using an intrauterine device (IUD) for contraception. Women do not get bacterial vaginosis from toilet seats, bedding, swimming pools, or contact with objects around them. SIGNS AND SYMPTOMS  Some women with bacterial vaginosis have no signs or symptoms. Common symptoms include:  Grey vaginal discharge.  A fishlike odor with discharge, especially after sexual intercourse.  Itching or burning of the vagina and vulva.  Burning or pain with urination. DIAGNOSIS  Your health care provider will take a medical history and examine the vagina for signs of bacterial vaginosis. A sample of vaginal fluid may be taken. Your health care provider will look at this sample under a microscope to check for bacteria and abnormal cells. A vaginal pH test may also be done.  TREATMENT  Bacterial vaginosis may be treated with antibiotic medicines. These may be given in the form of a pill or a vaginal cream. A second round of antibiotics may be prescribed if the  condition comes back after treatment.  HOME CARE INSTRUCTIONS   Only take over-the-counter or prescription medicines as directed by your health care provider.  If antibiotic medicine was prescribed, take it as directed. Make sure you finish it even if you start to feel better.  Do not have sex until treatment is completed.  Tell all sexual partners that you have a vaginal infection. They should see their health care provider and be treated if they have problems, such as a mild rash or itching.  Practice safe sex by using condoms and only having one sex partner. SEEK MEDICAL CARE IF:   Your symptoms are not improving after 3 days of treatment.  You have increased discharge or pain.  You have a fever. MAKE SURE YOU:   Understand these instructions.  Will watch your condition.  Will get help right away if you are not doing well or get worse. FOR MORE INFORMATION  Centers for Disease Control and Prevention, Division of STD Prevention: SolutionApps.co.zawww.cdc.gov/std American Sexual Health Association (ASHA): www.ashastd.org  Document Released: 06/02/2005 Document Revised: 03/23/2013 Document Reviewed: 01/12/2013 [facility] Patient Information 2014 [place]

## 2013-10-06 NOTE — Progress Notes (Signed)
Patient ID: Dana Hart    DOB: 08/29/1985, 28 y.o.   MRN: [id number] --- Subjective:  Dana Hart is a 28 y.o.female who presents with questions about her recent diagnosis of BV as well as concern of vaginal pain.  - vaginal pain: has been ongoing for over a year. She describes sharp pains that start in the vagina and sometimes radiate to the rectum. Intermittent. No related to sexual activity or bowel movements or urination. Can occur while sitting. No abdominal pain, no nausea or vomiting. Denies any dysuria or increased urinary frequency.  She was recently seen in the office for her PAP smear and was also found to have BV. She used metrogel and reports having had more frequent sharp pains while on the metrogel. Shortly after finishing treatment, she developed a cottage cheese like discharge that lasted 2-3 days associated with itching.   She read about bacteria on the Internet and became scared that she had strep A and was afraid that this was causing her pain.   ROS: see HPI Past Medical History: reviewed and updated medications and allergies. Social History: Tobacco: current smoker  Objective: Filed Vitals:   10/05/13 1550  BP: 111/74  Pulse: 74  Temp: 98.8 F (37.1 C)    Physical Examination:   General appearance - alert, mildly anxious appearing but in no acute distress Chest - clear to auscultation, no wheezes, rales or rhonchi, symmetric air entry Heart - normal rate, regular rhythm, normal S1, S2, no murmurs, rubs, clicks or gallops Abdomen - soft, mild suprapubic discomfort, no rebound, no guarding, no CVA tenderness

## 2013-10-07 ENCOUNTER — Encounter (HOSPITAL_COMMUNITY): Payer: Self-pay | Admitting: Emergency Medicine

## 2013-10-07 ENCOUNTER — Emergency Department (HOSPITAL_COMMUNITY)
Admission: EM | Admit: 2013-10-07 | Discharge: 2013-10-07 | Disposition: A | Discharge status: Home / Self Care | Payer: BC Managed Care – PPO | Attending: Emergency Medicine | Admitting: Emergency Medicine

## 2013-10-07 DIAGNOSIS — F17200 Nicotine dependence, unspecified, uncomplicated: Secondary | ICD-10-CM | POA: Insufficient documentation

## 2013-10-07 DIAGNOSIS — Y9389 Activity, other specified: Secondary | ICD-10-CM | POA: Insufficient documentation

## 2013-10-07 DIAGNOSIS — J45909 Unspecified asthma, uncomplicated: Secondary | ICD-10-CM | POA: Insufficient documentation

## 2013-10-07 DIAGNOSIS — R102 Pelvic and perineal pain: Secondary | ICD-10-CM | POA: Insufficient documentation

## 2013-10-07 DIAGNOSIS — Z880 Allergy status to penicillin: Secondary | ICD-10-CM | POA: Insufficient documentation

## 2013-10-07 DIAGNOSIS — IMO0002 Reserved for concepts with insufficient information to code with codable children: Secondary | ICD-10-CM | POA: Insufficient documentation

## 2013-10-07 DIAGNOSIS — F329 Major depressive disorder, single episode, unspecified: Secondary | ICD-10-CM | POA: Insufficient documentation

## 2013-10-07 DIAGNOSIS — T148XXA Other injury of unspecified body region, initial encounter: Secondary | ICD-10-CM

## 2013-10-07 DIAGNOSIS — Y92410 Unspecified street and highway as the place of occurrence of the external cause: Secondary | ICD-10-CM | POA: Insufficient documentation

## 2013-10-07 DIAGNOSIS — Z79899 Other long term (current) drug therapy: Secondary | ICD-10-CM | POA: Insufficient documentation

## 2013-10-07 DIAGNOSIS — F3289 Other specified depressive episodes: Secondary | ICD-10-CM | POA: Insufficient documentation

## 2013-10-07 MED ORDER — IBUPROFEN 400 MG PO TABS
800.0000 mg | ORAL_TABLET | Freq: Once | ORAL | Status: AC
  Administered 2013-10-07: 800 mg via ORAL
  Filled 2013-10-07: qty 2

## 2013-10-07 MED ORDER — HYDROCODONE-ACETAMINOPHEN 5-325 MG PO TABS: 1.0000 | ORAL_TABLET | ORAL | Status: DC | PRN

## 2013-10-07 MED ORDER — IBUPROFEN 800 MG PO TABS
800.0000 mg | ORAL_TABLET | Freq: Three times a day (TID) | ORAL | Status: DC
Start: 2013-10-07 — End: 2015-03-06

## 2013-10-07 MED ORDER — HYDROCODONE-ACETAMINOPHEN 5-325 MG PO TABS
1.0000 | ORAL_TABLET | Freq: Once | ORAL | Status: AC
  Administered 2013-10-07: 1 via ORAL
  Filled 2013-10-07: qty 1

## 2013-10-07 MED ORDER — CYCLOBENZAPRINE HCL 10 MG PO TABS: 10.0000 mg | ORAL_TABLET | Freq: Two times a day (BID) | ORAL | Status: DC | PRN

## 2013-10-07 NOTE — Assessment & Plan Note (Signed)
Unclear etiology. Patient had a bimanual exam last week which did not show any cervical or adnexal tenderness.  No constipation that could be causing referred pain.  Patient is resistant to repeat pelvic exam today. Since her last pelvic exam was so recently and she had onset of symptoms prior to last exam, will not repeat today. Given report of cottage cheese discharge and itching after metrogel, will treat for yeast with diflucan.  If not better or worst, patient to return to care. She agreed with this.  Additionally, spent a large amount of time reassuring patient that BV was not the same as strep bacteria and that it is a common cause of non sexually transmitted vaginitis. She appeared reassured after visit.
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
## 2013-10-07 NOTE — Assessment & Plan Note (Signed)
Patient to follow up with her psychiatrist next week
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
## 2013-10-07 NOTE — ED Notes (Signed)
Pt discharged to home with family. NAD.  

## 2013-10-19 NOTE — ED Provider Notes (Signed)
Medical screening examination/treatment/procedure(s) were performed by non-physician practitioner and as supervising physician I was immediately available for consultation/collaboration.   EKG Interpretation None        Pinchus Weckwerth T Connor Meacham, MD 10/19/13 0707 

## 2014-09-15 ENCOUNTER — Ambulatory Visit: Payer: Self-pay | Admitting: Family Medicine

## 2014-09-25 ENCOUNTER — Encounter: Payer: Self-pay | Admitting: Family Medicine

## 2015-01-05 ENCOUNTER — Ambulatory Visit (INDEPENDENT_AMBULATORY_CARE_PROVIDER_SITE_OTHER): Payer: 59 | Admitting: Family Medicine

## 2015-01-05 ENCOUNTER — Encounter: Payer: Self-pay | Admitting: Family Medicine

## 2015-01-05 VITALS — BP 109/62 | HR 78 | Temp 98.6°F | Ht 61.0 in | Wt 107.1 lb

## 2015-01-05 DIAGNOSIS — A499 Bacterial infection, unspecified: Secondary | ICD-10-CM | POA: Diagnosis not present

## 2015-01-05 DIAGNOSIS — N9489 Other specified conditions associated with female genital organs and menstrual cycle: Secondary | ICD-10-CM

## 2015-01-05 DIAGNOSIS — B9689 Other specified bacterial agents as the cause of diseases classified elsewhere: Secondary | ICD-10-CM | POA: Insufficient documentation

## 2015-01-05 DIAGNOSIS — N760 Acute vaginitis: Secondary | ICD-10-CM

## 2015-01-05 DIAGNOSIS — N898 Other specified noninflammatory disorders of vagina: Secondary | ICD-10-CM

## 2015-01-05 LAB — POCT WET PREP (WET MOUNT): CLUE CELLS WET PREP WHIFF POC: POSITIVE

## 2015-01-05 MED ORDER — METRONIDAZOLE 500 MG PO TABS: 500.0000 mg | ORAL_TABLET | Freq: Two times a day (BID) | ORAL | Status: AC

## 2015-01-05 NOTE — Assessment & Plan Note (Addendum)
Vaginal odor - Likely BV As no discharge, and patient declining STD testing, no other testing done Wet prep collected  We'll follow-up results and treat as indicated  Addendum:  Wet prep with many clue cells c/w BV - Treat with Flagyl BID x7d - Patient informed

## 2015-01-05 NOTE — Progress Notes (Signed)
   Subjective:   Dana Hart is a 29 y.o. female with a history of bipolar 2 here for vaginal odor  VAGINAL DISCHARGE/ODOR  Vaginal odor for 2 weeks - musty/fishy Normal thin, clear discharge Tried bathing in vinegar and Douching x1  Medications tried: no Recent antibiotic use: no Sex in last month: yes, one partner Possible STD exposure: no, one partner for last 12 yrs  Symptoms Fever: no Dysuria: no Vaginal bleeding: no Abdomen or Pelvic pain: yes - within last 30 days, sharp intermittent pain (feels like cramps) Back pain: no Genital sores or ulcers: no Rash: no Pain during sex: no Missed menstrual period: no, on IUD for 2-3 years  ROS see HPI Smoking Status noted  Objective:  BP 109/62 mmHg  Pulse 78  Temp(Src) 98.6 F (37 C) (Oral)  Ht  (1.549 m)  Wt 107 lb 2 oz (48.592 kg)  BMI 20.25 kg/m2  Gen:  29 y.o. female in NAD HEENT: NCAT, MMM, EOMI, PERRL, anicteric sclerae CV: RRR, no MRG Resp: Non-labored, CTAB, no wheezes noted Abd: Soft, NTND, BS present, no guarding or organomegaly Ext: WWP, no edema Gyn: Exam deferred, patient preferred to self collect wet prep and declined STD testing  Assessment:     Dana Hart is a 29 y.o. female here for vaginal odor.    Plan:     See problem list for problem-specific plans.   Erasmo Downer, MD PGY-2,  [facility] 01/05/2015  2:34 PM

## 2015-01-05 NOTE — Patient Instructions (Signed)
Nice to meet you today. This may be bacterial vaginosis. Her collecting swab and someone will call you later today when the results are available. If it is bacterial vaginosis, I will send a prescription for Flagyl for 7 days to your pharmacy.   Take care, Dr. B  Bacterial Vaginosis Bacterial vaginosis is a vaginal infection that occurs when the normal balance of bacteria in the vagina is disrupted. It results from an overgrowth of certain bacteria. This is the most common vaginal infection in women of childbearing age. Treatment is important to prevent complications, especially in pregnant women, as it can cause a premature delivery. CAUSES  Bacterial vaginosis is caused by an increase in harmful bacteria that are normally present in smaller amounts in the vagina. Several different kinds of bacteria can cause bacterial vaginosis. However, the reason that the condition develops is not fully understood. RISK FACTORS Certain activities or behaviors can put you at an increased risk of developing bacterial vaginosis, including:  Having a new sex partner or multiple sex partners.  Douching Women do not get bacterial vaginosis from toilet seats, bedding, swimming pools, or contact with objects around them. SIGNS AND SYMPTOMS  Some women with bacterial vaginosis have no signs or symptoms. Common symptoms include:  Grey vaginal discharge.  A fishlike odor with discharge, especially after sexual intercourse.  Itching or burning of the vagina and vulva.  Burning or pain with urination. DIAGNOSIS  Your health care provider will take a medical history and examine the vagina for signs of bacterial vaginosis. A sample of vaginal fluid may be taken. Your health care provider will look at this sample under a microscope to check for bacteria and abnormal cells. A vaginal pH test may also be done.  TREATMENT  Bacterial vaginosis may be treated with antibiotic medicines. These may be given in the form of  a pill or a vaginal cream. A second round of antibiotics may be prescribed if the condition comes back after treatment.  HOME CARE INSTRUCTIONS   Only take over-the-counter or prescription medicines as directed by your health care provider.  If antibiotic medicine was prescribed, take it as directed. Make sure you finish it even if you start to feel better.  Do not have sex until treatment is completed.  Tell all sexual partners that you have a vaginal infection. They should see their health care provider and be treated if they have problems, such as a mild rash or itching.  Practice safe sex by using condoms and only having one sex partner. SEEK MEDICAL CARE IF:   Your symptoms are not improving after 3 days of treatment.  You have increased discharge or pain.  You have a fever. MAKE SURE YOU:   Understand these instructions.  Will watch your condition.  Will get help right away if you are not doing well or get worse. FOR MORE INFORMATION  Centers for Disease Control and Prevention, Division of STD Prevention: [employer] American Sexual Health Association (ASHA): www.ashastd.org  Document Released: 06/02/2005 Document Revised: 03/23/2013 Document Reviewed: 01/12/2013 [facility] Patient Information 2015 [place], [place]. This information is not intended to replace advice given to you by your health care provider. Make sure you discuss any questions you have with your health care provider.

## 2015-02-01 ENCOUNTER — Telehealth: Payer: Self-pay

## 2015-02-01 ENCOUNTER — Ambulatory Visit (INDEPENDENT_AMBULATORY_CARE_PROVIDER_SITE_OTHER): Payer: 59

## 2015-02-01 ENCOUNTER — Ambulatory Visit (INDEPENDENT_AMBULATORY_CARE_PROVIDER_SITE_OTHER): Payer: 59 | Admitting: Emergency Medicine

## 2015-02-01 ENCOUNTER — Ambulatory Visit
Admission: RE | Admit: 2015-02-01 | Discharge: 2015-02-01 | Disposition: A | Discharge status: Home / Self Care | Payer: 59 | Source: Ambulatory Visit | Attending: Emergency Medicine | Admitting: Emergency Medicine

## 2015-02-01 VITALS — BP 100/60 | HR 75 | Temp 98.6°F | Resp 18 | Ht 62.0 in | Wt 100.2 lb

## 2015-02-01 DIAGNOSIS — R519 Headache, unspecified: Secondary | ICD-10-CM

## 2015-02-01 DIAGNOSIS — M542 Cervicalgia: Secondary | ICD-10-CM

## 2015-02-01 DIAGNOSIS — R51 Headache: Principal | ICD-10-CM

## 2015-02-01 MED ORDER — CYCLOBENZAPRINE HCL 5 MG PO TABS: 5.0000 mg | ORAL_TABLET | Freq: Three times a day (TID) | ORAL | Status: DC

## 2015-02-01 MED ORDER — MELOXICAM 7.5 MG PO TABS: 7.5000 mg | ORAL_TABLET | Freq: Every day | ORAL | Status: DC
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
## 2015-02-01 NOTE — Telephone Encounter (Signed)
Called pt per Dr. Cleta Alberts and let her know her CT scan was normal. Pt asked if she should follow up. I told her to see if the flexeril and mobic helps alleviate any neck pain that might be causing her headaches. If no relief then pt should follow up.  Any other advice for pt?

## 2015-02-01 NOTE — Patient Instructions (Signed)

## 2015-02-02 NOTE — Telephone Encounter (Signed)
Spoke to patient.  She reports that she is beginning to feel a little better.  Advised her that if she is not feeling better in 2-3 days to RTC.  She said she would do so.

## 2015-02-02 NOTE — Telephone Encounter (Signed)
Patient should be seen in 48-72 hours if she is not improving with treatment.

## 2015-03-06 ENCOUNTER — Encounter: Payer: Self-pay | Admitting: Family Medicine

## 2015-03-06 ENCOUNTER — Ambulatory Visit (INDEPENDENT_AMBULATORY_CARE_PROVIDER_SITE_OTHER): Payer: 59 | Admitting: Family Medicine

## 2015-03-06 VITALS — BP 107/48 | HR 78 | Temp 98.4°F | Ht 62.0 in | Wt 101.0 lb

## 2015-03-06 DIAGNOSIS — F3181 Bipolar II disorder: Secondary | ICD-10-CM | POA: Diagnosis not present

## 2015-03-06 DIAGNOSIS — B351 Tinea unguium: Secondary | ICD-10-CM

## 2015-03-06 DIAGNOSIS — R634 Abnormal weight loss: Secondary | ICD-10-CM | POA: Diagnosis not present

## 2015-03-06 DIAGNOSIS — F3160 Bipolar disorder, current episode mixed, unspecified: Secondary | ICD-10-CM | POA: Diagnosis not present

## 2015-03-06 DIAGNOSIS — Z0000 Encounter for general adult medical examination without abnormal findings: Secondary | ICD-10-CM

## 2015-03-06 LAB — COMPREHENSIVE METABOLIC PANEL
ALT: 10 U/L (ref 6–29)
AST: 15 U/L (ref 10–30)
Albumin: 4.7 g/dL (ref 3.6–5.1)
Alkaline Phosphatase: 59 U/L (ref 33–115)
BUN: 7 mg/dL (ref 7–25)
CHLORIDE: 104 mmol/L (ref 98–110)
CO2: 25 mmol/L (ref 20–31)
CREATININE: 0.54 mg/dL (ref 0.50–1.10)
Calcium: 9.4 mg/dL (ref 8.6–10.2)
Glucose, Bld: 84 mg/dL (ref 65–99)
Potassium: 4.3 mmol/L (ref 3.5–5.3)
SODIUM: 139 mmol/L (ref 135–146)
Total Bilirubin: 0.4 mg/dL (ref 0.2–1.2)
Total Protein: 7 g/dL (ref 6.1–8.1)

## 2015-03-06 LAB — TSH: TSH: 1.803 u[IU]/mL (ref 0.350–4.500)

## 2015-03-06 MED ORDER — TERBINAFINE HCL 250 MG PO TABS: 250.0000 mg | ORAL_TABLET | Freq: Every day | ORAL | Status: DC

## 2015-03-06 NOTE — Assessment & Plan Note (Signed)
Exam consistent with tinea pedis and onychomycosis. Has had tinea pedis over the last 2 years with treatment with Lamisil x 6 weeks in 2014. LFTs from 2014 normal. - Repeat LFTs today for baseline - Lamisil  daily x 12 week course (given #30 with 1 refill) - Patient urged to follow up in 4 weeks for repeat LFTs (at that time, can given 1 more refill of Lamisil) - Discussed this may not be effective, however patient would like to try. - Discussed return precautions.

## 2015-03-06 NOTE — Assessment & Plan Note (Signed)
Last pap smear in April 2015 was normal, will repeat in 2018 (at which time we can proceed with co-testing) Patient declined TDap and flu vaccine.  TSH today.

## 2015-03-06 NOTE — Progress Notes (Signed)
Patient ID: Dana Hart, female   DOB: [birth year], 29 y.o.   MRN: [id number] 29 y.o. year old female presents for well woman/preventative visit .  Acute Concerns: Profuse sweating: Patient noted sweating under her arms and her hands for the last several years. Notes she sweats more than family members and is malodorous. Has tried Degree, Dove, Suave, Certain Dri (over the counter).   Weight loss: She has not been able to keep her weight up, all her clothes fall off her. Poor appetite. Nausea if she forgets to eat for 6-8 hours. Notes she just doesn't have an appetite. No abdominal pain. No constipation or diarrhea. Palpitations with anxiety but not at other times. Very intolerant to heat.  Her skin is more dry than other people. No problems with pain in neck (thyroid region).  Father with hypothyroid. No other issues.  Bipolar 2 disorder:  Last went to the [facility] 1 year ago to see psychiatrist. Initially states it was because of insurance change but later notes frustration with medication ineffectiveness and side effects. Has tried Seroquel, Klonopin, Wellbutrin, and lamictal in the past. Note she was diagnosed here and never had true evaluation from psychiatrist. Notes continued problems with concentration, depressed mood. States about a year ago she went on a spending spree for several weeks and her mother ended up taking her out of work.    Diet: B: breakfast bar/cereal and fruit L: varies but often doesn't eat (sometimes cookies, chips, fruit cup) D: 2 vegetables (rice, brocili, pinto beans) and a bread, meat approximately every other day  Exercise: Doesn't exercise regularly  Birth Control: Has Mirena (placed at health dept November 24, 2011). No menses.   Social:  Social History   Social History  . Marital Status: Single    Spouse Name: N/A  . Number of Children: 2  . Years of Education: 12   Occupational History  . 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 Smoking status: Former Smoker    Quit date: 08/15/2014  . Smokeless tobacco: None  . Alcohol Use: 0.0 oz/week    0 Standard drinks or equivalent per week     Comment: occasional  . Drug Use: No  . Sexual Activity: Yes   Other Topics Concern  . None   Social History Narrative    Immunization:  Tdap/TD: Declined  Influenza: Declined   Cancer Screening:  Pap Smear: 09/21/2013 negative for intraepithelial lesion or malignancy  Physical Exam: Blood pressure 107/48, pulse 78, temperature 98.4 F (36.9 C), temperature source Oral, height  (1.575 m), weight 101 lb (45.813 kg). General: Lying in bed in NAD. Non-toxic Eyes: Conjunctivae non-injected.  ENTM: Moist mucous membranes. Oropharynx clear. No nasal discharge. TMs non-erythematous, non-bulging.  Neck: Supple, no LAD. No thyromegaly.  Cardiovascular: RRR. No murmurs, rubs, or gallops noted. No pitting edema noted. Respiratory: No increased WOB. CTAB without wheezing, rhonchi, or crackles noted. Abdomen: +BS, soft, non-distended, non-tender.  MSK: Normal bulk and tone. No gross deformities noted.  Skin: white scaly patches in the webs of the toes bilaterally. Thickened dry skin on the plantar aspect of the feet and heels. Several thickened, brittle toenails, most notable on the 5th toes bilaterally.   Neuro: A&O x4. No gross neurologic deficits  Psych:  Appropriate mood and affect. Speech non-labored.    ASSESSMENT & PLAN: 29 y.o. female presents for annual well woman/preventative exam. Please  see problem specific assessment and plan.

## 2015-03-06 NOTE — Patient Instructions (Signed)
Please follow up with me in 4 weeks so we can test your blood again. I will call you if your lab work is NOT normal, otherwise I will mail you a letter. I have referred you to psychiatry, I feel like it would be beneficial  Things to do to Keep yourself Healthy - Exercise at least 30-45 minutes a day,  3-4 days a week.  - Eat a low-fat diet with lots of fruits and vegetables, up to 7-9 servings per day. - Seatbelts can save your life. Wear them always. - Smoke detectors on every level of your home, check batteries every year. - Eye Doctor - have an eye exam every 1-2 years - Safe sex - if you may be exposed to STDs, use a condom. - Alcohol If you drink, do it moderately,less than 2 drinks per day. - Depression is common in our stressful world.If you're feeling down or losing interest in things you normally enjoy, please come in for a visit. - Violence - If anyone is threatening or hurting you, please call immediately.

## 2015-03-06 NOTE — Assessment & Plan Note (Signed)
Patient has not seen a psychiatrist in over 1 year. The patient's weight loss/anorexia could be secondary to depression component.  - Will get TSH to rule out weight loss and diaphoresis secondary to hyperthyroidism  - Referred patient to psychiatry  - Gave patient information on bipolar disease and coping mechanisms (as she has disliked medications). - Could consider Abilify in the future for mood stabilization.  - Discussed RTC precautions.

## 2015-03-07 ENCOUNTER — Encounter: Payer: Self-pay | Admitting: Family Medicine

## 2015-03-15 ENCOUNTER — Telehealth: Payer: Self-pay | Admitting: Family Medicine

## 2015-03-15 DIAGNOSIS — Z0000 Encounter for general adult medical examination without abnormal findings: Secondary | ICD-10-CM

## 2015-03-15 NOTE — Telephone Encounter (Signed)
Needs blood work such as A1C and other blood work done for her job. Please call her so she can explain

## 2015-03-19 NOTE — Telephone Encounter (Signed)
Spoke with patient, she needs orders for an A1c, cholesterol, and triglycerides drawn for work. Recently had CPE on 9/20. Will forward to PCP.

## 2015-03-20 NOTE — Telephone Encounter (Signed)
Patient informed, will call back to schedule lab visit.

## 2015-03-20 NOTE — Telephone Encounter (Signed)
Please let the patient know I placed those orders. She needs to be fasting for this blood work. She should make a lab appointment.   Also, please remind the patient she sould follow up with me at the end of October so we can monitor her liver function in the setting of taking Lamisil.   Thanks, Joanna Puff, MD [facility] Resident  03/20/2015, 8:51 AM

## 2015-04-17 ENCOUNTER — Ambulatory Visit: Payer: 59 | Admitting: Family Medicine

## 2015-07-19 ENCOUNTER — Encounter: Payer: Self-pay | Admitting: Family Medicine

## 2015-07-19 ENCOUNTER — Ambulatory Visit (INDEPENDENT_AMBULATORY_CARE_PROVIDER_SITE_OTHER): Payer: 59 | Admitting: Family Medicine

## 2015-07-19 VITALS — BP 118/41 | HR 75 | Temp 98.4°F | Ht 62.0 in | Wt 101.0 lb

## 2015-07-19 DIAGNOSIS — B351 Tinea unguium: Secondary | ICD-10-CM | POA: Diagnosis not present

## 2015-07-19 DIAGNOSIS — Z0000 Encounter for general adult medical examination without abnormal findings: Secondary | ICD-10-CM | POA: Diagnosis not present

## 2015-07-19 DIAGNOSIS — F3181 Bipolar II disorder: Secondary | ICD-10-CM | POA: Diagnosis not present

## 2015-07-19 NOTE — Progress Notes (Signed)
Patient ID: Dana Hart, female   DOB: 11-Jun-1986, 30 y.o.   MRN: [id number]    Subjective: CC: biometric screening HPI: Patient is a 30 y.o. female with a past medical history of bipolar disorder per report presenting to clinic today for biometric screening/lab work for work..  Bipolar disorder: started seeing psychiatry yesterday. They started her on something (possibly Effexor?). Has not yet started taking medication. Feels her symptoms are stable. No SI/HI, AVH, or manic symptoms per her report.   Works at [employer]. She notes that she gets a bonus if all her numbers are good. She last ate 1 hour ago.   Additionally, patient states she would like her Mirena taken out. Notes that she gets BV frequently (almostly monthly) and believes the IUD is to blame. She does not come to clinic when she has these symptoms, she uses home remedies such as vinegar. Not currently have vaginal discharge, dysuria, or urinary urgency.  Toe nail fungus: doing better. Completed Lamisil.   Social History: non-smoker  Health Maintenance: declines flu and TDaP vaccines today  ROS: All other systems reviewed and are negative besides that noted in HPI  Past Medical History Patient Active Problem List   Diagnosis Date Noted  . Onychomycosis 03/06/2015  . Bacterial vaginosis 01/05/2015  . Vaginal pain 10/07/2013  . Tobacco use disorder 09/21/2013  . Bipolar 2 disorder (HCC) 04/25/2013  . Tinea pedis 03/30/2013  . Healthcare maintenance 06/14/2011  . Night sweats 06/13/2011  . Depression 06/13/2011    Medications- reviewed and updated Current Outpatient Prescriptions  Medication Sig Dispense Refill  . terbinafine (LAMISIL) 250 MG tablet Take 1 tablet (250 mg total) by mouth daily. 30 tablet 1   No current facility-administered medications for this visit.    Objective: Office vital signs reviewed. BP 118/41 mmHg  Pulse 75  Temp(Src) 98.4 F (36.9 C) (Oral)  Ht   (1.575 m)  Wt 101 lb (45.813 kg)  BMI 18.47 kg/m2   Physical Examination:  General: Awake, alert, well- nourished, NAD Cardio: RRR, no m/r/g noted. No pitting edema. Pulm: No increased WOB.  CTAB, without wheezes, rhonchi or crackles noted.  GI: soft, NT/ND,+BS x4, no hepatomegaly, no splenomegaly MSK: Normal gait and station Skin: dry, intact, no rashes or lesions   Assessment/Plan: Healthcare maintenance Labwork for work require fasting lipid panel and CBG for >9 hours. Discussed that she would need to make a lab appt in the morning to have this done. BMI at goal.   Bipolar 2 disorder Seems to be stable. Asked her to have psychiatry send her recent note with new medications that were prescribed.     Orders Placed This Encounter  Procedures  . Lipid Panel    Standing Status: Future     Number of Occurrences:      Standing Expiration Date: 07/18/2016    Order Specific Question:  Has the patient fasted?    Answer:  Yes  . POCT CBG (Fasting - Glucose)    Standing Status: Future     Number of Occurrences:      Standing Expiration Date: 07/18/2016    No orders of the defined types were placed in this encounter.    Joanna Puff PGY-2, [facility]

## 2015-07-19 NOTE — Patient Instructions (Signed)
Schedule an appointment with the lab first thing in the morning. Come fasting: No eating or drinking anything for at least 9 hours (you can bring a snack for after).  I will fill out your form and fax it- we will let you know when we have done this.

## 2015-07-20 NOTE — Assessment & Plan Note (Signed)
Seems to be stable. Asked her to have psychiatry send her recent note with new medications that were prescribed.

## 2015-07-20 NOTE — Assessment & Plan Note (Signed)
Labwork for work require fasting lipid panel and CBG for >9 hours. Discussed that she would need to make a lab appt in the morning to have this done. BMI at goal.

## 2015-08-02 ENCOUNTER — Other Ambulatory Visit (INDEPENDENT_AMBULATORY_CARE_PROVIDER_SITE_OTHER): Payer: 59

## 2015-08-02 DIAGNOSIS — Z0000 Encounter for general adult medical examination without abnormal findings: Secondary | ICD-10-CM | POA: Diagnosis not present

## 2015-08-02 DIAGNOSIS — B351 Tinea unguium: Secondary | ICD-10-CM

## 2015-08-02 LAB — COMPREHENSIVE METABOLIC PANEL
ALBUMIN: 4.7 g/dL (ref 3.6–5.1)
ALT: 15 U/L (ref 6–29)
AST: 17 U/L (ref 10–30)
Alkaline Phosphatase: 59 U/L (ref 33–115)
BUN: 9 mg/dL (ref 7–25)
CALCIUM: 9 mg/dL (ref 8.6–10.2)
CHLORIDE: 106 mmol/L (ref 98–110)
CO2: 22 mmol/L (ref 20–31)
CREATININE: 0.67 mg/dL (ref 0.50–1.10)
Glucose, Bld: 84 mg/dL (ref 65–99)
POTASSIUM: 4 mmol/L (ref 3.5–5.3)
Sodium: 138 mmol/L (ref 135–146)
TOTAL PROTEIN: 7.2 g/dL (ref 6.1–8.1)
Total Bilirubin: 0.8 mg/dL (ref 0.2–1.2)

## 2015-08-02 LAB — LIPID PANEL
CHOLESTEROL: 135 mg/dL (ref 125–200)
HDL: 46 mg/dL (ref >=46)
LDL CALC: 81 mg/dL (ref <130)
TRIGLYCERIDES: 39 mg/dL (ref <150)
Total CHOL/HDL Ratio: 2.9 Ratio (ref <=5.0)
VLDL: 8 mg/dL (ref <30)

## 2015-08-02 LAB — GLUCOSE, CAPILLARY: GLUCOSE-CAPILLARY: 90 mg/dL (ref 65–99)

## 2015-08-02 NOTE — Progress Notes (Signed)
Cmp, flp and cbg done today [facility] Donice Alperin

## 2015-08-08 ENCOUNTER — Encounter: Payer: Self-pay | Admitting: Family Medicine

## 2015-08-08 ENCOUNTER — Ambulatory Visit (INDEPENDENT_AMBULATORY_CARE_PROVIDER_SITE_OTHER): Payer: 59 | Admitting: Family Medicine

## 2015-08-08 VITALS — BP 107/56 | HR 72 | Temp 99.0°F | Wt 104.0 lb

## 2015-08-08 DIAGNOSIS — R300 Dysuria: Secondary | ICD-10-CM

## 2015-08-08 LAB — POCT URINALYSIS DIPSTICK
Bilirubin, UA: NEGATIVE
GLUCOSE UA: NEGATIVE
KETONES UA: NEGATIVE
Leukocytes, UA: NEGATIVE
NITRITE UA: NEGATIVE
Protein, UA: NEGATIVE
SPEC GRAV UA: 1.02
UROBILINOGEN UA: 1
pH, UA: 7

## 2015-08-08 LAB — POCT UA - MICROSCOPIC ONLY

## 2015-08-08 NOTE — Progress Notes (Signed)
   Subjective:    Patient ID: Dana Hart, female    DOB: 1986-02-27, 30 y.o.   MRN: [id number]  HPI  Feeling Hot Cold For last 5-7 days.  Has checked temp several times no fever.  Is having mild diarrhea and mild increase in urinary frequency and mild dysuria. Also mild increased sweating.  No cough runny nose chest pain abdomen pain, rash, adenopathy, or back pain.  Has been on Venlafaxine for 2 weeks with taking twice daily for one week.  LMP - 3 years ago on mirena  Chief Complaint noted Review of Symptoms - see HPI PMH - Smoking status noted.   Vital Signs reviewed  Review of Systems     Objective:   Physical Exam  Alert nad Ears:  External ear exam shows no significant lesions or deformities.  Otoscopic examination reveals clear canals, tympanic membranes are intact bilaterally without bulging, retraction, inflammation or discharge. Hearing is grossly normal bilaterall Mouth - no lesions, mucous membranes are moist, no decaying teeth  Throat: normal mucosa, no exudate, uvula midline, no redness Neck:  No deformities, thyromegaly, masses, or tenderness noted.   Supple with full range of motion without pain. Skin:  Intact without suspicious lesions or rashes Lungs:  Normal respiratory effort, chest expands symmetrically. Lungs are clear to auscultation, no crackles or wheezes. Heart - Regular rate and rhythm.  No murmurs, gallops or rubs.    Abdomen: soft and without masses, organomegaly or hernias noted.  Mild tenderness bilat lower quadrants (had not noticed before) No guarding or rebound Extremities:  No cyanosis, edema, or deformity noted with good range of motion of all major joints.    UA - trace leuks many epis      Assessment & Plan:   Hot Cold Sensations No evident cause.  Could be mild gi viral infection.  No signs of focal or diffuse bacterial infection.  Could be related to venlafaxine.  Discussed options with her See after visit summary for plan

## 2015-08-08 NOTE — Patient Instructions (Signed)
I am not sure exactly what is causing your symptoms  It could be diarrhea and mild GI bug - if the diarrhea gets worse or if you have bad abdomen pain or if you have fever then come back immediately  Contact your psychiatrist about going down on the venlafaxine to 1 daily to see if this helps  If you are not better in 1 week or are any worse then come back and we will check labs

## 2015-09-17 ENCOUNTER — Telehealth: Payer: Self-pay | Admitting: Family Medicine

## 2015-09-19 NOTE — Telephone Encounter (Signed)
Opened in error

## 2015-11-26 ENCOUNTER — Other Ambulatory Visit (HOSPITAL_COMMUNITY)
Admission: RE | Admit: 2015-11-26 | Discharge: 2015-11-26 | Disposition: A | Discharge status: Home / Self Care | Payer: 59 | Source: Ambulatory Visit | Attending: Family Medicine | Admitting: Family Medicine

## 2015-11-26 ENCOUNTER — Ambulatory Visit (INDEPENDENT_AMBULATORY_CARE_PROVIDER_SITE_OTHER): Payer: 59 | Admitting: Family Medicine

## 2015-11-26 VITALS — BP 119/68 | HR 71 | Temp 98.5°F | Ht 62.0 in | Wt 102.4 lb

## 2015-11-26 DIAGNOSIS — B9689 Other specified bacterial agents as the cause of diseases classified elsewhere: Secondary | ICD-10-CM

## 2015-11-26 DIAGNOSIS — M79672 Pain in left foot: Secondary | ICD-10-CM | POA: Diagnosis not present

## 2015-11-26 DIAGNOSIS — A499 Bacterial infection, unspecified: Secondary | ICD-10-CM

## 2015-11-26 DIAGNOSIS — M79673 Pain in unspecified foot: Secondary | ICD-10-CM | POA: Diagnosis not present

## 2015-11-26 DIAGNOSIS — N760 Acute vaginitis: Secondary | ICD-10-CM | POA: Diagnosis not present

## 2015-11-26 DIAGNOSIS — Z113 Encounter for screening for infections with a predominantly sexual mode of transmission: Secondary | ICD-10-CM | POA: Diagnosis present

## 2015-11-26 DIAGNOSIS — N898 Other specified noninflammatory disorders of vagina: Secondary | ICD-10-CM

## 2015-11-26 LAB — POCT WET PREP (WET MOUNT): CLUE CELLS WET PREP WHIFF POC: POSITIVE

## 2015-11-26 MED ORDER — METRONIDAZOLE 500 MG PO TABS: 500.0000 mg | ORAL_TABLET | Freq: Two times a day (BID) | ORAL | Status: DC

## 2015-11-26 NOTE — Assessment & Plan Note (Signed)
Patient presenting with symptoms and exam findings consistent with BV. Dyspareunia is unusual however no fevers, chills, or cervical motion tenderness.  - Flagyl 500mg  BID x 7 days. Discussed disulfiram reaction.  - GC/chlamydia ordered. - if symptoms do not resolve, patient advised to follow up in our clinic.

## 2015-11-26 NOTE — Progress Notes (Signed)
Subjective: CC: vaginal bleeding/discharge HPI: Patient is a 30 y.o. female presenting to clinic today for concerns of vaginal bleeding and discharge.  Spotting/vaginal discharge: She's had the Mirena for 4 years. She never had bleeding once the Mirena was placed.  Over the last month she's noticed vaginal bleeding for 1-2 days. Sometimes it is just spotting, other times it is like a period. She'll then got 1-2 weeks without any bleeding. After the bleeding she has a foul smelling discharge. It smells like fish. It is light brown/beige discharge that lasts 1-2 days. Has has cramping intermittently. No sharp pains.  Dysparenia x 3-4 months. No new partners. Married, monogamous. She dose not check for her IUD string.   Foot pain: cramping bilaterally, L>R, feels like Charlie horses. Her 2nd toe gets stuck. The other toes feel like they're "shifting." This occurred 3-4x/last week. She has tendnerness at the bases of her MTPs on the dorsal aspect of her feet. Nothing on the plantar aspect. She normally wears tennis shoes, just started wearing sandals. No pain when she wears tennis shoes. All her pain is when she's barefoot or in sandals. She sometimes has this sensation when she's lying supine, therefore it doesn't have to be weight bearing induced. No swelling, warmth, or h/o injury.   Social History: non-smoker  ROS: All other systems reviewed and are negative.  Past Medical History Patient Active Problem List   Diagnosis Date Noted  . Foot pain 11/26/2015  . Onychomycosis 03/06/2015  . Bacterial vaginosis 01/05/2015  . Vaginal pain 10/07/2013  . Tobacco use disorder 09/21/2013  . Bipolar 2 disorder (HCC) 04/25/2013  . Tinea pedis 03/30/2013  . Healthcare maintenance 06/14/2011  . Night sweats 06/13/2011  . Depression 06/13/2011    Medications- reviewed and updated Current Outpatient Prescriptions  Medication Sig Dispense Refill  . metroNIDAZOLE (FLAGYL) 500 MG tablet Take 1  tablet (500 mg total) by mouth 2 (two) times daily. 14 tablet 0  . venlafaxine (EFFEXOR) 37.5 MG tablet Take 37.5 mg by mouth 2 (two) times daily.     No current facility-administered medications for this visit.    Objective: Office vital signs reviewed. BP 119/68 mmHg  Pulse 71  Temp(Src) 98.5 F (36.9 C) (Oral)  Ht 5\' 2"  (1.575 m)  Wt 102 lb 6.4 oz (46.448 kg)  BMI 18.72 kg/m2   Physical Examination:  General: Awake, alert, well- nourished, NAD  Feet: Decreased longitudinal and transverse arches. No abnormalities or tenderness noted on the right foot otherwise.  Tenderness to palpation in the web between the first and second toes on the left foot. Positive squeeze. No swelling or erythema noted. No tenderness at the and spot.  Normal dorsiflexion bilaterally. Patient refusing to plantarflex due to concerns of pain with this movement. Sensation intact. Normal DP pulses bilaterally, brisk capillary refill.   GYN:  External genitalia within normal limits.  Vaginal mucosa pink, moist, normal rugae.  Nonfriable cervix without lesions. IUD string noted. Minimal amount of thin beige colored discharge noted Bimanual exam revealed normal, nongravid uterus.  No cervical motion tenderness. No adnexal masses bilaterally.    Wet prep: Moderate clue cells, >20 WBCs, +whiff test.   Assessment/Plan: Bacterial vaginosis Patient presenting with symptoms and exam findings consistent with BV. Dyspareunia is unusual however no fevers, chills, or cervical motion tenderness.  - Flagyl 500mg  BID x 7 days. Discussed disulfiram reaction.  - GC/chlamydia ordered. - if symptoms do not resolve, patient advised to follow up in our clinic.  Orders Placed This Encounter  Procedures  . Ambulatory referral to Sports Medicine    Referral Priority:  Routine    Referral Type:  Consultation    Number of Visits Requested:  1  . POCT Wet Prep [facility])    Meds ordered this encounter  Medications    . metroNIDAZOLE (FLAGYL) 500 MG tablet    Sig: Take 1 tablet (500 mg total) by mouth 2 (two) times daily.    Dispense:  14 tablet    Refill:  0    Joanna Puff PGY-2, [facility] Medicine

## 2015-11-26 NOTE — Patient Instructions (Signed)
I have prescribed Flagyl for the bacterial vaginosis. Do not drink alcohol with this. If your symptoms do not improve: pain with intercourse, lower quadrant pain, discharge, please follow back up with me. I have referred you to Sports medicine, I think you may benefit from orthotics- they can evaluate you for this.  Bacterial Vaginosis Bacterial vaginosis is a vaginal infection that occurs when the normal balance of bacteria in the vagina is disrupted. It results from an overgrowth of certain bacteria. This is the most common vaginal infection in women of childbearing age. Treatment is important to prevent complications, especially in pregnant women, as it can cause a premature delivery. CAUSES  Bacterial vaginosis is caused by an increase in harmful bacteria that are normally present in smaller amounts in the vagina. Several different kinds of bacteria can cause bacterial vaginosis. However, the reason that the condition develops is not fully understood. RISK FACTORS Certain activities or behaviors can put you at an increased risk of developing bacterial vaginosis, including:  Having a new sex partner or multiple sex partners.  Douching.  Using an intrauterine device (IUD) for contraception. Women do not get bacterial vaginosis from toilet seats, bedding, swimming pools, or contact with objects around them. SIGNS AND SYMPTOMS  Some women with bacterial vaginosis have no signs or symptoms. Common symptoms include:  Grey vaginal discharge.  A fishlike odor with discharge, especially after sexual intercourse.  Itching or burning of the vagina and vulva.  Burning or pain with urination. DIAGNOSIS  Your health care provider will take a medical history and examine the vagina for signs of bacterial vaginosis. A sample of vaginal fluid may be taken. Your health care provider will look at this sample under a microscope to check for bacteria and abnormal cells. A vaginal pH test may also be done.    TREATMENT  Bacterial vaginosis may be treated with antibiotic medicines. These may be given in the form of a pill or a vaginal cream. A second round of antibiotics may be prescribed if the condition comes back after treatment. Because bacterial vaginosis increases your risk for sexually transmitted diseases, getting treated can help reduce your risk for chlamydia, gonorrhea, HIV, and herpes. HOME CARE INSTRUCTIONS   Only take over-the-counter or prescription medicines as directed by your health care provider.  If antibiotic medicine was prescribed, take it as directed. Make sure you finish it even if you start to feel better.  Tell all sexual partners that you have a vaginal infection. They should see their health care provider and be treated if they have problems, such as a mild rash or itching.  During treatment, it is important that you follow these instructions:  Avoid sexual activity or use condoms correctly.  Do not douche.  Avoid alcohol as directed by your health care provider.  Avoid breastfeeding as directed by your health care provider. SEEK MEDICAL CARE IF:   Your symptoms are not improving after 3 days of treatment.  You have increased discharge or pain.  You have a fever. MAKE SURE YOU:   Understand these instructions.  Will watch your condition.  Will get help right away if you are not doing well or get worse. FOR MORE INFORMATION  Centers for Disease Control and Prevention, Division of STD Prevention: [employer] American Sexual Health Association (ASHA): www.ashastd.org    This information is not intended to replace advice given to you by your health care provider. Make sure you discuss any questions you have with your health care provider.  Document Released: 06/02/2005 Document Revised: 06/23/2014 Document Reviewed: 01/12/2013 Elsevier Interactive Patient Education [employer]2016 Elsevier Inc.

## 2015-11-27 LAB — CERVICOVAGINAL ANCILLARY ONLY
CHLAMYDIA, DNA PROBE: NEGATIVE
NEISSERIA GONORRHEA: NEGATIVE

## 2015-11-28 ENCOUNTER — Encounter: Payer: Self-pay | Admitting: Family Medicine

## 2015-12-04 ENCOUNTER — Ambulatory Visit (INDEPENDENT_AMBULATORY_CARE_PROVIDER_SITE_OTHER): Payer: 59 | Admitting: Family Medicine

## 2015-12-04 ENCOUNTER — Encounter: Payer: Self-pay | Admitting: Family Medicine

## 2015-12-04 VITALS — BP 101/56 | HR 72 | Ht 62.0 in | Wt 102.0 lb

## 2015-12-04 DIAGNOSIS — M7741 Metatarsalgia, right foot: Secondary | ICD-10-CM | POA: Diagnosis not present

## 2015-12-04 DIAGNOSIS — M7742 Metatarsalgia, left foot: Secondary | ICD-10-CM | POA: Diagnosis not present

## 2015-12-04 DIAGNOSIS — M2142 Flat foot [pes planus] (acquired), left foot: Secondary | ICD-10-CM

## 2015-12-04 DIAGNOSIS — M2141 Flat foot [pes planus] (acquired), right foot: Secondary | ICD-10-CM

## 2015-12-07 ENCOUNTER — Encounter: Payer: Self-pay | Admitting: Family Medicine

## 2015-12-07 NOTE — Progress Notes (Signed)
  Dana Hart - 30 y.o. female MRN [id number]  Date of birth: 12/23/1985  CC: b/l foot pain  SUBJECTIVE:   HPI L>R foot pain which has been intermittent for years. Had been worsening for the last few months until the last few weeks when it began improving.  Now virtually no pain.  Feels occasional pop.  No swelling. Never limits activity. Discussed footwear and she recalls wearing many more flip flops during the aggravated prior.  She doesn't notice pain when in sandals, but does have pain when barefoot.  Pain remains in forefoot, mostly in the region of her 2nd/3rd toes.  No radiation of pain.  Mostly dorsal.  No swelling.   ROS:     As above, no fevers chills or night sweats. No weight loss.   HISTORY: Past Medical, Surgical, Social, and Family History Reviewed & Updated per EMR.  Pertinent Historical Findings include: Former smoker, Bipolar, asthma as child  OBJECTIVE: BP 101/56 mmHg  Pulse 72  Ht 5\' 2"  (1.575 m)  Wt 102 lb (46.267 kg)  BMI 18.65 kg/m2  Physical Exam  Calm, NAD Non-labored breathing  Pes planus. Difficulty doing heel raise.  NO swelling.  Tender both plantarly, but more so dorsally overlying the 2-4th MTP, L>R.  Negative squeeze.  TTP in the 1st and 2nd interdigital spaces, but no more so than over the MTPs themselves. Strength 5/5.    Shoes with no arch support and narrow toe box.    Feet NV intact.   MEDICATIONS, LABS & OTHER ORDERS: Previous Medications   METRONIDAZOLE (FLAGYL) 500 MG TABLET    Take 1 tablet (500 mg total) by mouth 2 (two) times daily.   VENLAFAXINE (EFFEXOR) 37.5 MG TABLET    Take 37.5 mg by mouth 2 (two) times daily.   Modified Medications   No medications on file   New Prescriptions   No medications on file   Discontinued Medications   No medications on file  No orders of the defined types were placed in this encounter.   ASSESSMENT & PLAN: B/l foot pain with flat feet: Early morton's changes. No specific diagnosis other than  generalized metatarsalgia with poor arch support and crowding of the toebox.  Added scaphoid pads to shoes which she stated felt better. Recommended limiting sandal wearing, other than those with good arch support.  Recommended spenco or goodfeet insoles and offered to make orthotics if she did not have resolution of pain with OTC insoles. Patient agreeable to this plan and will call back as needed.

## 2016-11-21 ENCOUNTER — Ambulatory Visit (INDEPENDENT_AMBULATORY_CARE_PROVIDER_SITE_OTHER): Payer: 59 | Admitting: Family Medicine

## 2016-11-21 ENCOUNTER — Encounter: Payer: Self-pay | Admitting: Family Medicine

## 2016-11-21 VITALS — BP 108/66 | HR 78 | Temp 98.5°F | Ht 62.0 in | Wt 104.0 lb

## 2016-11-21 DIAGNOSIS — Z309 Encounter for contraceptive management, unspecified: Secondary | ICD-10-CM | POA: Diagnosis not present

## 2016-11-21 DIAGNOSIS — Z30432 Encounter for removal of intrauterine contraceptive device: Secondary | ICD-10-CM

## 2016-11-21 DIAGNOSIS — IMO0001 Reserved for inherently not codable concepts without codable children: Secondary | ICD-10-CM

## 2016-11-21 DIAGNOSIS — Z3009 Encounter for other general counseling and advice on contraception: Secondary | ICD-10-CM | POA: Insufficient documentation

## 2016-11-21 NOTE — Patient Instructions (Addendum)
Once your Toney ReilMirena is out, you can get pregnant.  I recommend using condoms to prevent pregnancy.  If you decide you want to start another birth control, please follow up in our clinic.  Follow up within the next few months for an annual exam     Contraception Choices Contraception, also called birth control, means things to use or ways to try not to get pregnant. Hormonal birth control This kind of birth control uses hormones. Here are some types of hormonal birth control:  A tube that is put under skin of the arm (implant). The tube can stay in for as long as 3 years.  Shots to get every 3 months (injections).  Pills to take every day (birth control pills).  A patch to change 1 time each week for 3 weeks (birth control patch). After that, the patch is taken off for 1 week.  A ring to put in the vagina. The ring is left in for 3 weeks. Then it is taken out of the vagina for 1 week. Then a new ring is put in.  Pills to take after unprotected sex (emergency birth control pills).  Barrier birth control Here are some types of barrier birth control:  A thin covering that is put on the penis before sex (female condom). The covering is thrown away after sex.  A soft, loose covering that is put in the vagina before sex (female condom). The covering is thrown away after sex.  A rubber bowl that sits over the cervix (diaphragm). The bowl must be made for you. The bowl is put into the vagina before sex. The bowl is left in for 6-8 hours after sex. It is taken out within 24 hours.  A small, soft cup that fits over the cervix (cervical cap). The cup must be made for you. The cup can be left in for 6-8 hours after sex. It is taken out within 48 hours.  A sponge that is put into the vagina before sex. It must be left in for at least 6 hours after sex. It must be taken out within 30 hours. Then it is thrown away.  A chemical that kills or stops sperm from getting into the uterus (spermicide). It  may be a pill, cream, jelly, or foam to put in the vagina. The chemical should be used at least 10-15 minutes before sex.  IUD (intrauterine) birth control An IUD is a small, T-shaped piece of plastic. It is put inside the uterus. There are two kinds:  Hormone IUD. This kind can stay in for 3-5 years.  Copper IUD. This kind can stay in for 10 years.  Permanent birth control Here are some types of permanent birth control:  Surgery to block the fallopian tubes.  Having an insert put into each fallopian tube.  Surgery to tie off the tubes that carry sperm (vasectomy).  Natural planning birth control Here are some types of natural planning birth control:  Not having sex on the days the woman could get pregnant.  Using a calendar: ? To keep track of the length of each period. ? To find out what days pregnancy can happen. ? To plan to not have sex on days when pregnancy can happen.  Watching for symptoms of ovulation and not having sex during ovulation. One way the woman can check for ovulation is to check her temperature.  Waiting to have sex until after ovulation.  Summary  Contraception, also called birth control, means things to use  or ways to try not to get pregnant.  Hormonal methods of birth control include implants, injections, pills, patches, vaginal rings, and emergency birth control pills.  Barrier methods of birth control can include female condoms, female condoms, diaphragms, cervical caps, sponges, and spermicides.  There are two types of IUD (intrauterine device) birth control. An IUD can be put in a woman's uterus to prevent pregnancy for 3-5 years.  Permanent sterilization can be done through a procedure for males, females, or both.  Natural planning methods involve not having sex on the days when the woman could get pregnant. This information is not intended to replace advice given to you by your health care provider. Make sure you discuss any questions you have  with your health care provider. Document Released: 03/30/2009 Document Revised: 06/12/2016 Document Reviewed: 06/12/2016 Elsevier Interactive Patient Education  2017 [employer].

## 2016-11-21 NOTE — Assessment & Plan Note (Addendum)
Discussed contraception. Patient declines LARCs or even OCPs (which would give her regular periods). Discussed alternative forms of contraception such as condom use to prevent pregnancy. Patient feels her and her husband are on the same page currently. Patient to f/u for annual exam (noted due for pap smear after procedure unfortunately)  PROCEDURE NOTE: IUD removal Patient given informed consent for IUD removal. She is aware this will stop the birth control method provided by the IUD immediately. Informed consent given and signed copy in the chart.Appropriate time out taken. Patient placed in the lithotomy position and the cervix brought into view using speculum. The IUD strings were identified coming from the cervical os. These strings were grasped with ring forceps, and the IUD withdrawn gently from the uterus. There were no complications and no blood loss. Patient tolerated the procedure well.

## 2016-11-21 NOTE — Progress Notes (Signed)
    Subjective: CC:IUD removal HPI: Patient is a 31 y.o. female  presenting to clinic today for an IUD removal.  The patient states she's been on contraceptions, Depo-Provera and IUD, for the last 8 years. She states she has not had a period since that time and feels it may just be time for her body to be off them for small amount of time.  She denies a desire to conceive. She is married. No concerns for STDs  Her [facility]  Social History:  Former smoker   ROS: All other systems reviewed and are negative.  Past Medical History Patient Active Problem List   Diagnosis Date Noted  . Contraception 11/21/2016  . Foot pain 11/26/2015  . Onychomycosis 03/06/2015  . Tobacco use disorder 09/21/2013  . Bipolar 2 disorder (HCC) 04/25/2013  . Tinea pedis 03/30/2013  . Healthcare maintenance 06/14/2011  . Night sweats 06/13/2011  . Depression 06/13/2011    Medications- reviewed and updated Current Outpatient Prescriptions  Medication Sig Dispense Refill  . metroNIDAZOLE (FLAGYL) 500 MG tablet Take 1 tablet (500 mg total) by mouth 2 (two) times daily. 14 tablet 0  . venlafaxine (EFFEXOR) 37.5 MG tablet Take 37.5 mg by mouth 2 (two) times daily.     No current facility-administered medications for this visit.     Objective: Office vital signs reviewed. BP 108/66   Pulse 78   Temp 98.5 F (36.9 C) (Oral)   Ht 5\' 2"  (1.575 m)   Wt 104 lb (47.2 kg)   SpO2 99%   BMI 19.02 kg/m    Physical Examination:  General: Awake, alert, well- nourished, NAD GYN:  External genitalia within normal limits.  Vaginal mucosa pink, moist, normal rugae.  Nonfriable cervix without lesions, no discharge or bleeding noted on speculum exam. Stings in place.       Assessment/Plan: Contraception Discussed contraception. Patient declines LARCs or even OCPs (which would give her regular periods). Discussed alternative forms of contraception such as condom use  to prevent pregnancy. Patient feels her and her husband are on the same page currently. Patient to f/u for annual exam (noted due for pap smear after procedure unfortunately)  PROCEDURE NOTE: IUD removal Patient given informed consent for IUD removal. She is aware this will stop the birth control method provided by the IUD immediately. Informed consent given and signed copy in the chart.Appropriate time out taken. Patient placed in the lithotomy position and the cervix brought into view using speculum. The IUD strings were identified coming from the cervical os. These strings were grasped with ring forceps, and the IUD withdrawn gently from the uterus. There were no complications and no blood loss. Patient tolerated the procedure well.    No orders of the defined types were placed in this encounter.   No orders of the defined types were placed in this encounter.   Joanna Puffrystal S. Timara Loma PGY-3, [facility]

## 2017-07-09 ENCOUNTER — Encounter: Payer: 59 | Admitting: Internal Medicine

## 2018-08-10 ENCOUNTER — Encounter: Payer: Self-pay | Admitting: Student in an Organized Health Care Education/Training Program

## 2019-04-14 ENCOUNTER — Other Ambulatory Visit (HOSPITAL_COMMUNITY)
Admission: RE | Admit: 2019-04-14 | Discharge: 2019-04-14 | Disposition: A | Discharge status: Home / Self Care | Payer: 59 | Source: Ambulatory Visit | Attending: Family Medicine | Admitting: Family Medicine

## 2019-04-14 ENCOUNTER — Other Ambulatory Visit: Payer: Self-pay

## 2019-04-14 ENCOUNTER — Ambulatory Visit (INDEPENDENT_AMBULATORY_CARE_PROVIDER_SITE_OTHER): Payer: 59 | Admitting: Student in an Organized Health Care Education/Training Program

## 2019-04-14 ENCOUNTER — Encounter: Payer: Self-pay | Admitting: Student in an Organized Health Care Education/Training Program

## 2019-04-14 VITALS — BP 112/58 | HR 87 | Ht 62.0 in | Wt 108.1 lb

## 2019-04-14 DIAGNOSIS — Z124 Encounter for screening for malignant neoplasm of cervix: Secondary | ICD-10-CM | POA: Diagnosis not present

## 2019-04-14 DIAGNOSIS — Z3009 Encounter for other general counseling and advice on contraception: Secondary | ICD-10-CM

## 2019-04-14 NOTE — Patient Instructions (Signed)
It was a pleasure to see you today!  To summarize our discussion for this visit:  We completed your Pap smear today.  I will contact you with those results.  I will send in an oral birth control pill for you to trial for a month and you can let me know if you would like to come back for an IUD.  I would recommend taking a prenatal vitamin at this time.  For the pimples in your ear I would start with an over-the-counter wash with salicylic acid or benzoyl peroxide  Some additional health maintenance measures we should update are: Health Maintenance Due  Topic Date Due  . TETANUS/TDAP  05/16/2004  . PAP SMEAR-Modifier  09/21/2016  .    Call the clinic at [phone] if your symptoms worsen or you have any concerns.   Thank you for allowing me to take part in your care,  Dr. Doristine Mango

## 2019-04-14 NOTE — Progress Notes (Signed)
   Subjective:    Patient ID: Dana Hart, female    DOB: 1986/05/08, 33 y.o.   MRN: [id number]  CC: physical  HPI:  Patient due for pap smear today and has questions about birth control and bumps in her ear.  LMP sept 4 and regular since IUD removed. She thinks she would like to have IUD placed again. Does not desire another pregnancy. Currently using "pull out method" with husband. Has no pelvic complaints or concern for STD testing today. Denies tobacco use.   Ear bumps- whiteheads in right ear for about 6 months. Denies erythema, pain, fevers, effecting hearing. She has popped them and has scant white drainage. Nothing on left ear and never happened before. Patient does get pustular acne on face. Has not started any new cosmetic products.  Smoking status reviewed   ROS: pertinent noted in the HPI   I have personally reviewed pertinent past medical history, surgical, family, and social history as appropriate.  Objective:  BP (!) 112/58   Pulse 87   Ht 5\' 2"  (1.575 m)   Wt 108 lb 2 oz (49 kg)   LMP 02/17/2019   SpO2 100%   BMI 19.78 kg/m   Vitals and nursing note reviewed  General: NAD, pleasant, able to participate in exam HEENT: Left ear normal Right ear has closed comedone without drainage or surrounding erythema, rest of ear exam normal and non-tender to manipulation. Cardiac: RRR, S1 S2 present. normal heart sounds, no murmurs. Respiratory: CTAB, normal effort, No wheezes, rales or rhonchi Extremities: no edema or cyanosis. Skin: warm and dry, no rashes noted other than noted on ear exam Neuro: alert, no obvious focal deficits Psych: Normal affect and mood  Assessment & Plan:   Birth control counseling - discussed different contraceptive methods. Patient elected to use OCP for short term and then get an IUD. I was planning on prescribing this medication but since patient has not had period since Sept 4th and not using reliable method, will hold off until patient  can take pregnancy test. Also recommended she take prenatal vitamins until on stable contraceptive method.    Meds ordered this encounter  Medications  . DISCONTD: norgestimate-ethinyl estradiol (SPRINTEC 28) 0.25-35 MG-MCG tablet    Sig: Take 1 tablet by mouth daily.    Dispense:  1 Package    Refill:  0  . DISCONTD: norgestimate-ethinyl estradiol (SPRINTEC 28) 0.25-35 MG-MCG tablet    Sig: Take 1 tablet by mouth daily.    Dispense:  1 Package    Refill:  [facility] PGY-2

## 2019-04-17 MED ORDER — NORGESTIMATE-ETH ESTRADIOL 0.25-35 MG-MCG PO TABS: 1.0000 | ORAL_TABLET | Freq: Every day | ORAL | 11 refills | Status: DC

## 2019-04-17 MED ORDER — NORGESTIMATE-ETH ESTRADIOL 0.25-35 MG-MCG PO TABS: 1.0000 | ORAL_TABLET | Freq: Every day | ORAL | 0 refills | Status: DC

## 2019-04-17 NOTE — Assessment & Plan Note (Signed)
-   discussed different contraceptive methods. Patient elected to use OCP for short term and then get an IUD. I was planning on prescribing this medication but since patient has not had period since Sept 4th and not using reliable method, will hold off until patient can take pregnancy test. Also recommended she take prenatal vitamins until on stable contraceptive method.

## 2019-04-20 LAB — CYTOLOGY - PAP
Comment: NEGATIVE
Diagnosis: NEGATIVE
High risk HPV: NEGATIVE

## 2019-10-31 NOTE — Progress Notes (Signed)
Subjective:   Patient ID: Dana Hart    DOB: 1985/09/28, 34 y.o. female   MRN: [id number]  Dana Hart is a 34 y.o. female here for "missed period"  Amenorrhea  Positive Pregnancy Test Patient presents today for missed period. LMP 09/18/2019. She notes she did have regular periods for the most part. The last period was a lighter period which is abnormal for her.   She is currently not on contraception. She had her IUD removed in 2018. She was interested in having IUD repleaced but this was deferred until she had two negative pregnancy tests. Appears patient did not follow up for OCP's or IUD placement. She is married. Sexually active with one female partners in the last 12 months. This is an unplanned but wanted pregnancy.  Denies vaginal bleeding, discharge, dyspareunia.   Patient is a [id number]. She has two prior births via c-section. Her first c-section was due to prolonged labor and fetal bradycardia. She scheduled her second to be TOLAC but due to prolonged labor she had repeat c-section. Patient endorses history of one abortion at 7 weeks when she was 33 years old. Her two children are currently 48 years old and 55 years old.  Patient does drink wine - 1-2 glasses of wine / night. Last drink several nights ago.  Denies tobacco/vaping use.  Denies any other illicit drugs.  She denies any current medications. She is supposed to be taking a new bipolar medication (unsure of name) prescribed by Dr. Loni Muse. She plans to call and schedule follow up appointment to discuss medication and safety during pregnancy.  She is not currently on a prenatal vitamin.   Denies any history of GDM or high blood pressure during prior pregnancies. She denies any problems during pregnancy or postpartum complications. Patient did get treated for postpartum depression after her second baby.  Based on LMP she is [redacted]w[redacted]d.   Review of Systems:  Per HPI.   Objective:   BP (!) 98/58   Pulse 78   Ht 5\' 2"  (1.575 m)    Wt 107 lb 12.8 oz (48.9 kg)   LMP 09/18/2019 (Exact Date)   SpO2 100%   BMI 19.72 kg/m  Vitals and nursing note reviewed.  General: Very pleasant young female, sitting comfortably on exam bed, well nourished, well developed, in no acute distress with non-toxic appearance Resp: Speaking in full sentences, breathing comfortably on room air MSK: gait normal Neuro: Alert and oriented, speech normal Psych: Appears worried at times but in good spirits. She is conversant and pleasant. Affect does not appear depressed, blunted, tearful or flat. Alert, communicative  and cooperative with normal attention span and concentration. No apparent delusions, illusions, hallucinations. Denies SI/HI.   Assessment & Plan:   Positive pregnancy test Patient found to have positive pregnancy test today. This is an unplanned but wanted pregnancy.  Based on LMP on 09/18/19, she is ~[redacted]w[redacted]d. She is currently a [id number] with 2 prior C-sections - initial c-section secondary to fetal bradycardia.  PMH and OB history reviewed with no prior pregnancy complications. PMH significant for bipolar disorder. She denies tobacco use.  She was counseled to avoid alcohol. Reviewed medications to avoid during pregnancy. Instructed to start Prenatal vitamin today.  - Patient should avoid cat litter, deli meats, and unpasteurized cheeses.  - Dating ultrasound scheduled - First OB appointment scheduled - Patient instructed to talk to psychiatrist Dr. Loni Muse regarding bipolar medications (patient currently has not started prescribed meds)  Elevated PHQ-9 Score  Bipolar Disorder: Patient with elevated PHQ-9 score of 19 today in setting of known and untreated bipolar disorder. Answered "Not at all" to question #9.  She endorses history of thoughts of hurting herself but has never acted upon this. She denies any active thoughts of hurting herself. Denies any SI/HI. She is followed by psychiatrist Dr. Mervyn Skeeters. Has been prescribed medication but has not  started. She plans on scheduling appointment this week.   Orders Placed This Encounter  Procedures  . US OB LESS THAN 14 WEEKS WITH OB TRANSVAGINAL    Standing Status:   Future    Standing Expiration Date:   12/31/2020    Order Specific Question:   Reason for Exam (SYMPTOM  OR DIAGNOSIS REQUIRED)    Answer:   positive pregnancy test    Order Specific Question:   Preferred Imaging Location?    Answer:   [facility] Ultrasound  . POCT urine pregnancy   Orpah Cobb, DO PGY-2, [facility] 11/01/2019 9:03 PM

## 2019-11-01 ENCOUNTER — Ambulatory Visit (INDEPENDENT_AMBULATORY_CARE_PROVIDER_SITE_OTHER): Payer: 59 | Admitting: Family Medicine

## 2019-11-01 ENCOUNTER — Encounter: Payer: Self-pay | Admitting: Family Medicine

## 2019-11-01 ENCOUNTER — Other Ambulatory Visit: Payer: Self-pay

## 2019-11-01 VITALS — BP 98/58 | HR 78 | Ht 62.0 in | Wt 107.8 lb

## 2019-11-01 DIAGNOSIS — N926 Irregular menstruation, unspecified: Secondary | ICD-10-CM | POA: Diagnosis not present

## 2019-11-01 DIAGNOSIS — Z3201 Encounter for pregnancy test, result positive: Secondary | ICD-10-CM | POA: Insufficient documentation

## 2019-11-01 LAB — POCT URINE PREGNANCY: Preg Test, Ur: POSITIVE — AB

## 2019-11-01 NOTE — Assessment & Plan Note (Addendum)
Patient found to have positive pregnancy test today. This is an unplanned but wanted pregnancy.  Based on LMP on 09/18/19, she is ~[redacted]w[redacted]d. She is currently a [id number] with 2 prior C-sections - initial c-section secondary to fetal bradycardia.  PMH and OB history reviewed with no prior pregnancy complications. PMH significant for bipolar disorder. She denies tobacco use.  She was counseled to avoid alcohol. Reviewed medications to avoid during pregnancy. Instructed to start Prenatal vitamin today.  - Patient should avoid cat litter, deli meats, and unpasteurized cheeses.  - Dating ultrasound scheduled - First OB appointment scheduled - Patient instructed to talk to psychiatrist Dr. Mervyn Skeeters regarding bipolar medications (patient currently has not started prescribed meds)

## 2019-11-01 NOTE — Patient Instructions (Addendum)
Thank you for coming to see me today. It was a pleasure to see you.   Congratulations on your pregnancy!  I know this is a tough time with all the new changes but we are here for you if you need anything.    We have scheduled you for a dating ultrasound which will allow [place] to see how far along you are. This is scheduled for 11/08/19. Please be sure to attend this appointment as scheduled.  Please start taking a prenatal vitamin immediately.  It is recommended that you avoid alcohol and tobacco while pregnant.  Please talk to Dr. Mervyn Skeeters about your bipolar medications to ensure safety during pregnancy.  Schedule you for your first OB appointment on 11/21/19 at 3:10 pm.  If you have any questions or concerns, please do not hesitate to call the office at [phone].  Take Care,   Dr. Orpah Cobb, DO Resident Physician [facility] [phone]   Below is a list of safe meds to take during pregnancy.  Safe Medications in Pregnancy   Acne: Benzoyl Peroxide Salicylic Acid  Backache/Headache: Tylenol: 2 regular strength every 4 hours OR              2 Extra strength every 6 hours  Colds/Coughs/Allergies: Benadryl (alcohol free) 25 mg every 6 hours as needed Breath right strips Claritin Cepacol throat lozenges Chloraseptic throat spray Cold-Eeze- up to three times per day Cough drops, alcohol free Flonase (by prescription only) Guaifenesin Mucinex Robitussin DM (plain only, alcohol free) Saline nasal spray/drops Sudafed (pseudoephedrine) & Actifed ** use only after [redacted] weeks gestation and if you do not have high blood pressure Tylenol Vicks Vaporub Zinc lozenges Zyrtec   Constipation: Colace Ducolax suppositories Fleet enema Glycerin suppositories Metamucil Milk of magnesia Miralax Senokot Smooth move tea  Diarrhea: Kaopectate Imodium A-D  *NO pepto Bismol  Hemorrhoids: Anusol Anusol HC Preparation  H Tucks  Indigestion: Tums Maalox Mylanta Zantac  Pepcid  Insomnia: Benadryl (alcohol free) 25mg  every 6 hours as needed Tylenol PM Unisom, no Gelcaps  Leg Cramps: Tums MagGel  Nausea/Vomiting:  Bonine Dramamine Emetrol Ginger extract Sea bands Meclizine  Nausea medication to take during pregnancy:  Unisom (doxylamine succinate 25 mg tablets) Take one tablet daily at bedtime. If symptoms are not adequately controlled, the dose can be increased to a maximum recommended dose of two tablets daily (1/2 tablet in the morning, 1/2 tablet mid-afternoon and one at bedtime). Vitamin B6 100mg  tablets. Take one tablet twice a day (up to 200 mg per day).  Skin Rashes: Aveeno products Benadryl cream or 25mg  every 6 hours as needed Calamine Lotion 1% cortisone cream  Yeast infection: Gyne-lotrimin 7 Monistat 7   **If taking multiple medications, please check labels to avoid duplicating the same active ingredients **take medication as directed on the label ** Do not exceed 4000 mg of tylenol in 24 hours **Do not take medications that contain aspirin or ibuprofen

## 2019-11-03 ENCOUNTER — Telehealth: Payer: Self-pay

## 2019-11-03 NOTE — Telephone Encounter (Signed)
Patient calls nurse line regarding dizziness. Patient was seen in office for positive pregnancy test on 11/01/19. Patient states that she has increased fluid intake and trying to eat more, however, dizziness still persists.   Spoke with preceptor ([place]),advised that patient should report to MAU immediately if she begins experiencing abdominal pain or vaginal bleeding. Also recommended that patient be scheduled follow up in office next week. Scheduled with Dr. Darin Engels for Monday 5/24 at 10 am. Preceptor also advised that patient could try OTC meclizine for dizziness.   Patient informed of above and verbalizes understanding.   Strict return and ED precautions given.   To PCP and Dr. Carolee Rota, RN

## 2019-11-07 ENCOUNTER — Encounter: Payer: Self-pay | Admitting: Family Medicine

## 2019-11-07 ENCOUNTER — Ambulatory Visit (INDEPENDENT_AMBULATORY_CARE_PROVIDER_SITE_OTHER): Payer: 59 | Admitting: Family Medicine

## 2019-11-07 ENCOUNTER — Ambulatory Visit
Admission: RE | Admit: 2019-11-07 | Discharge: 2019-11-07 | Disposition: A | Discharge status: Home / Self Care | Payer: 59 | Source: Ambulatory Visit | Attending: Family Medicine | Admitting: Family Medicine

## 2019-11-07 ENCOUNTER — Other Ambulatory Visit: Payer: Self-pay

## 2019-11-07 VITALS — BP 114/68 | HR 75 | Ht 62.0 in | Wt 108.0 lb

## 2019-11-07 DIAGNOSIS — Z3201 Encounter for pregnancy test, result positive: Secondary | ICD-10-CM | POA: Diagnosis not present

## 2019-11-07 DIAGNOSIS — Z3A01 Less than 8 weeks gestation of pregnancy: Secondary | ICD-10-CM

## 2019-11-07 DIAGNOSIS — R42 Dizziness and giddiness: Secondary | ICD-10-CM | POA: Insufficient documentation

## 2019-11-07 DIAGNOSIS — F3181 Bipolar II disorder: Secondary | ICD-10-CM | POA: Diagnosis not present

## 2019-11-07 LAB — POCT URINALYSIS DIP (MANUAL ENTRY)
Bilirubin, UA: NEGATIVE
Blood, UA: NEGATIVE
Glucose, UA: NEGATIVE mg/dL
Ketones, POC UA: NEGATIVE mg/dL
Leukocytes, UA: NEGATIVE
Nitrite, UA: NEGATIVE
Protein Ur, POC: NEGATIVE mg/dL
Spec Grav, UA: 1.02 (ref 1.010–1.025)
Urobilinogen, UA: 0.2 E.U./dL
pH, UA: 7.5 (ref 5.0–8.0)

## 2019-11-07 MED ORDER — MECLIZINE HCL 25 MG PO TABS: 25.0000 mg | ORAL_TABLET | Freq: Every day | ORAL | 0 refills | Status: DC | PRN

## 2019-11-07 NOTE — Assessment & Plan Note (Signed)
Patient with likely peripheral vertigo. HINTS exam points towards peripheral vertigo.  Discussed meclizine.  Per up-to-date this is okay for pregnancy and an infant wrist stool this also showed appropriate for pregnancy use. Per previous notes Dr. Crissie Reese also approved this medication.  Advised to increase water intake and decrease caffeine.  EKG within normal limits.  Also obtain lab work including CBC to rule out anemia, TSH to rule out thyroid dysfunction, BMP to rule out metabolic disorder.  Strict return precautions given.  ED precautions discussed.  Should follow-up in 1 month.  Sooner if worsening

## 2019-11-07 NOTE — Progress Notes (Signed)
SUBJECTIVE:   CHIEF COMPLAINT / HPI:   Dizziness  Patient called nurse line on 5/20 with dizziness.  Dr. Crissie Reese was consulted and recommended patient have follow-up in clinic this week.  MAU precautions discussed with patient.  Of note patient was diagnosed with pregnancy on 5/18.  Unclear gestational age so dating ultrasound was ordered.  Patient reports dizziness occurs "randomly". States that when she found out she was pregnant she has had Hart dizziness. When she is dizzy the room is spinning. Works from home and says she can be on the phone and it feels like she is holding her breath. Has to sit and deep breath to resolve symptoms. Was told to increase water intake but this is causing her to have to urinate Hart, which interferes with work. Drinks ~2-2.5 16oz water bottles day. Denies caffeine intake. If not drinking water it is juice or sprite. Reports decreased sleep, goes to bed at 10pm and gets up ~2-3am. Stays up till 7am and then naps.   Reports she took tylenol, but no other medications. Dizziness is improved with laying down. Reports worse when she "pushes herself".   PHQ2 elevated Reports being frustrated with husband for being pregnant. Reports increased anxiety 2/2 pregnancy. Has called a psychiatrist, Dr. Mervyn Skeeters, who prescribed medications. Was started on zoloft but did not pick it up because she was scared to start a new medication without seeing a doctor. Sees an NP at the psychiatrist office, [facility]. Does not like this office and would like to switch. Does see a therapist at Dr. Carie Caddy office, [facility] which helps.   Depression screen [facility] 2/9 11/07/2019 11/07/2019 11/01/2019 11/01/2019 11/21/2016  Decreased Interest 3 3 3 2  0  Down, Depressed, Hopeless 1 1 1 1  0  PHQ - 2 Score 4 4 4 3  0  Altered sleeping 3 - 3 - -  Tired, decreased energy 3 - 3 - -  Change in appetite 0 - 2 - -  Feeling bad or failure about yourself  1 - 1 - -  Trouble concentrating 3  - 3 - -  Moving slowly or fidgety/restless 2 - 3 - -  Suicidal thoughts 0 - 0 - -  PHQ-9 Score 16 - 19 - -  Difficult doing work/chores Extremely dIfficult - - - -    PERTINENT  PMH / PSH: tobacco use, bipolar 2  OBJECTIVE:   BP 114/68   Pulse 75   Ht 5\' 2"  (1.575 m)   Wt 108 lb (49 kg)   LMP 09/18/2019 (Exact Date)   SpO2 100%   BMI 19.75 kg/m   Gen: awake and alert, NAD HEENT: moist mucous membranes, PERRL, EOMI, 4 beat horizontal nystagmus bilaterally  Cardio: RRR, no MRG Resp: CTAB, no wheezes, rales or rhonchi GI: soft, non tender, non d PHQ-9 seems to be somewhat improved.  No SI/HI.  Istended, bowel sounds present  Ext: no edema Neuro: CN2-12 intact, sensation intact, head impulse abnormal, unidirectional horizontal nystagmus, no skew deviation, no finger to nose dysmetria, gait normal   ASSESSMENT/PLAN:   Positive pregnancy test Patient with proximal to the pregnancy test diagnosed in office on 5/18.  Has upcoming initial OB appointment.  No initial OB labs on file so will obtain today. New OB Labs obtained: OB panel including HIV, OB urine culture, urine dipstick, and Hemoglobinopathy.    Dizziness Patient with likely peripheral vertigo. HINTS exam points towards peripheral vertigo.  Discussed meclizine.  Per up-to-date this is  okay for pregnancy and an infant wrist stool this also showed appropriate for pregnancy use. Per previous notes Dr. Dione Plover also approved this medication.  Advised to increase water intake and decrease caffeine.  EKG within normal limits.  Also obtain lab work including CBC to rule out anemia, TSH to rule out thyroid dysfunction, BMP to rule out metabolic disorder.  Strict return precautions given.  ED precautions discussed.  Should follow-up in 1 month.  Sooner if worsening  Bipolar 2 disorder PHQ-9 somewhat improved.  No SI/HI.  Patient has psychiatry follow-up as well as behavioral health counseling.  Advised to continue to follow-up with them.   Given that patient does not like her current psychiatrist have given list of psychiatrists in the area for her to call.  She states she will call them if desired.  Follow-up with PCP.     Dana Hart, Dana Hart

## 2019-11-07 NOTE — Assessment & Plan Note (Signed)
PHQ-9 somewhat improved.  No SI/HI.  Patient has psychiatry follow-up as well as behavioral health counseling.  Advised to continue to follow-up with them.  Given that patient does not like her current psychiatrist have given list of psychiatrists in the area for her to call.  She states she will call them if desired.  Follow-up with PCP.

## 2019-11-07 NOTE — Patient Instructions (Signed)
1. For your dizziness -I have gotten labs -I recommend seeing physical therapy, they will call you -You can take meclizine as needed  2. For your anxiety -call your therapist    Therapy and Counseling Resources Most providers on this list will take Medicaid. Patients with commercial insurance or Medicare should contact their insurance company to get a list of in network providers.  [employer]  [address], [place], [place] [address]      [place] [address]  [place], [place] [address] [phone]  [place] [address] [place]  [place], [place] [address]       [phone]      Jinny Blossom [facility] [address] [place], [place]  [facility]:  [place]. [place]  [facility]:  [place] [address] [place]oni [place], [place]  [facility] (under & uninsured) [address] [place] [phone]    [email or web address]    [facility] [place]     Phone:  [phone]     [facility] [place]  [place] [address]      [place], [place] ext [place]: [place], [place], [place]   [place] ([place] therapist) [address] [place] [address]   [place] [address]    [phone]    [facility]   [place],  [place], [place]   [employer] [place]  [place]  [facility]  [address] [place], [place]        [phone]  Open Access/Walk In Clinic under & uninsured [place], To schedule an appointment call [phone]- [phone] [address] [place] [phone]):  Fay Records from 8 AM - 3 PM Moving June 1 to  [employer] at [facility] [address] [place],  ([place])   [place], [place]: [phone]) 8:30 - 12; 1 - 2:30  [facility] [place],  [place], [place]    ([phone]):8:30 - 12; 2 - 3PM  [facility] [place],  [address]  [place]; [phone]):   Mon - Fri 8 AM - 5 PM  [facility] [place]  MWF 12:30 to 3:00 or call to schedule an appointment  [phone]  Specific Provider options Psychology Today  [email or web address] 1. click on find a therapist  2. enter your zip code 3. left side and select or tailor a therapist for your specific need.   [facility] Provider Directory [email or web address]  (Medicaid)   Follow all drop down to find a provider  [place] or [email or web address] [address], [place], [place] Recovery support and educational   In home counseling [place] Telephone: [phone]  office in [email or web address] [email or web address]   Does not take reg. Medicaid or Medicare private insurance BCCS,  health Choice, [facility], [place], [place], [place], [place] Health Choice  24- Hour Availability:  . [place] or [phone]  . [facility] of the [employer] [phone]  [employer]  [phone]   . [facility] [employer]  [phone] (after hours)  . Therapeutic Alternative/Mobile Crisis   [phone]  . [place] National Suicide Hotline  606-403-2066 (TALK)  . Call 911 or go to emergency room  . [employer]  [phone]);  [place] and [employer]   . [employer] ACCESS  [phone]); [place], [place], [place], [place], [place], [place], [place]

## 2019-11-07 NOTE — Assessment & Plan Note (Signed)
Patient with proximal to the pregnancy test diagnosed in office on 5/18.  Has upcoming initial OB appointment.  No initial OB labs on file so will obtain today. New OB Labs obtained: OB panel including HIV, OB urine culture, urine dipstick, and Hemoglobinopathy.

## 2019-11-08 ENCOUNTER — Ambulatory Visit
Admission: RE | Admit: 2019-11-08 | Discharge: 2019-11-08 | Disposition: A | Discharge status: Home / Self Care | Payer: 59 | Source: Ambulatory Visit | Attending: Family Medicine | Admitting: Family Medicine

## 2019-11-08 DIAGNOSIS — R42 Dizziness and giddiness: Secondary | ICD-10-CM | POA: Diagnosis not present

## 2019-11-08 DIAGNOSIS — Z3201 Encounter for pregnancy test, result positive: Secondary | ICD-10-CM

## 2019-11-09 ENCOUNTER — Telehealth: Payer: Self-pay | Admitting: Family Medicine

## 2019-11-09 LAB — OBSTETRIC PANEL, INCLUDING HIV
Antibody Screen: NEGATIVE
Basophils Absolute: 0 10*3/uL (ref 0.0–0.2)
Basos: 0 %
EOS (ABSOLUTE): 0 10*3/uL (ref 0.0–0.4)
Eos: 1 %
HIV Screen 4th Generation wRfx: NONREACTIVE
Hematocrit: 37.4 % (ref 34.0–46.6)
Hemoglobin: 11.8 g/dL (ref 11.1–15.9)
Hepatitis B Surface Ag: NEGATIVE
Immature Grans (Abs): 0 10*3/uL (ref 0.0–0.1)
Immature Granulocytes: 0 %
Lymphocytes Absolute: 2.5 10*3/uL (ref 0.7–3.1)
Lymphs: 31 %
MCH: 26.3 pg — ABNORMAL LOW (ref 26.6–33.0)
MCHC: 31.6 g/dL (ref 31.5–35.7)
MCV: 84 fL (ref 79–97)
Monocytes Absolute: 0.6 10*3/uL (ref 0.1–0.9)
Monocytes: 8 %
Neutrophils Absolute: 4.7 10*3/uL (ref 1.4–7.0)
Neutrophils: 60 %
Platelets: 351 10*3/uL (ref 150–450)
RBC: 4.48 x10E6/uL (ref 3.77–5.28)
RDW: 14.2 % (ref 11.7–15.4)
RPR Ser Ql: NONREACTIVE
Rh Factor: POSITIVE
Rubella Antibodies, IGG: 4.81 index (ref >0.99)
WBC: 7.9 10*3/uL (ref 3.4–10.8)

## 2019-11-09 LAB — HGB FRACTIONATION CASCADE
Hgb A2: 2.5 % (ref 1.8–3.2)
Hgb A: 97.2 % (ref 96.4–98.8)
Hgb F: 0.3 % (ref 0.0–2.0)
Hgb S: 0 %

## 2019-11-09 LAB — BASIC METABOLIC PANEL
BUN/Creatinine Ratio: 9 (ref 9–23)
BUN: 5 mg/dL — ABNORMAL LOW (ref 6–20)
CO2: 18 mmol/L — ABNORMAL LOW (ref 20–29)
Calcium: 9.5 mg/dL (ref 8.7–10.2)
Chloride: 102 mmol/L (ref 96–106)
Creatinine, Ser: 0.56 mg/dL — ABNORMAL LOW (ref 0.57–1.00)
GFR calc Af Amer: 141 mL/min/{1.73_m2} (ref >59)
GFR calc non Af Amer: 122 mL/min/{1.73_m2} (ref >59)
Glucose: 81 mg/dL (ref 65–99)
Potassium: 3.9 mmol/L (ref 3.5–5.2)
Sodium: 137 mmol/L (ref 134–144)

## 2019-11-09 LAB — TSH: TSH: 0.612 u[IU]/mL (ref 0.450–4.500)

## 2019-11-09 MED ORDER — CEPHALEXIN 500 MG PO CAPS
500.0000 mg | ORAL_CAPSULE | Freq: Four times a day (QID) | ORAL | 0 refills | Status: AC
Start: 2019-11-09 — End: 2019-11-14

## 2019-11-09 NOTE — Telephone Encounter (Signed)
Discussed results of urine culture with patient.  Advised treatment to avoid preterm labor or miscarriage.  Patient is agreeable with this plan.  I discussed her penicillin allergy.  She states she did not have anaphylaxis but cannot remember the reaction as it happened when she was a kid and mother has not told her the full story.  Does not think it was severe.  We discussed multiple options for treatment including Keflex versus Macrobid.  Patient has opted for Keflex.  I gave her strict return precautions and symptoms for allergies.  I advised if she gets allergic reaction to Keflex she should go to the emergency room.  Patient was able to verbalize this plan.  Patient to follow-up with initial OB visit in the beginning of June.  Orpah Clinton, PGY-3 [place] [facility] 11/09/2019 10:15 AM

## 2019-11-11 LAB — URINE CULTURE, OB REFLEX

## 2019-11-11 LAB — CULTURE, OB URINE

## 2019-11-15 ENCOUNTER — Telehealth: Payer: Self-pay

## 2019-11-15 ENCOUNTER — Other Ambulatory Visit: Payer: Self-pay | Admitting: Family Medicine

## 2019-11-15 MED ORDER — MICONAZOLE NITRATE 2 % VA CREA: 1.0000 | TOPICAL_CREAM | Freq: Every day | VAGINAL | 0 refills | Status: AC

## 2019-11-15 NOTE — Telephone Encounter (Signed)
Miconazole ointment provided for patient. She is pregnant so unable to take oral agents. Advise follow up in clinic ASAP as patient is pregnant and having vaginal discharge. Of note she does have f/u with Dr. Obie Dredge on 6/7, if discharge is worsening should f/u sooner  Oralia Manis, DO, PGY-3 [facility] 11/15/2019 12:08 PM g

## 2019-11-15 NOTE — Telephone Encounter (Signed)
Patient calls nurse line stating she was given Keflex on 5/26 and has been taking it since. Patient reports now she has a yeast infection. Patient reports a "very itchy white discharge" has developed. Patient is requesting something for yeast. Will forward to provider who saw patient.  [facility] on [address]

## 2019-11-16 ENCOUNTER — Ambulatory Visit (INDEPENDENT_AMBULATORY_CARE_PROVIDER_SITE_OTHER): Payer: 59 | Admitting: Family Medicine

## 2019-11-16 ENCOUNTER — Other Ambulatory Visit (HOSPITAL_COMMUNITY)
Admission: RE | Admit: 2019-11-16 | Discharge: 2019-11-16 | Disposition: A | Discharge status: Home / Self Care | Payer: 59 | Source: Ambulatory Visit | Attending: Family Medicine | Admitting: Family Medicine

## 2019-11-16 ENCOUNTER — Other Ambulatory Visit: Payer: Self-pay

## 2019-11-16 VITALS — BP 100/80 | HR 79 | Ht 62.0 in | Wt 108.0 lb

## 2019-11-16 DIAGNOSIS — N898 Other specified noninflammatory disorders of vagina: Secondary | ICD-10-CM

## 2019-11-16 DIAGNOSIS — B3731 Acute candidiasis of vulva and vagina: Secondary | ICD-10-CM

## 2019-11-16 DIAGNOSIS — B373 Candidiasis of vulva and vagina: Secondary | ICD-10-CM

## 2019-11-16 LAB — POCT WET PREP (WET MOUNT)
Clue Cells Wet Prep Whiff POC: NEGATIVE
Trichomonas Wet Prep HPF POC: ABSENT

## 2019-11-16 NOTE — Patient Instructions (Addendum)
1. Please complete your antibiotic course 2. Please use the miconazole I sent in to the pharmacy. It is a vaginal cream  3. Follow up if no improvement in 1 week

## 2019-11-16 NOTE — Progress Notes (Signed)
    SUBJECTIVE:   CHIEF COMPLAINT / HPI:   Vaginal irritation  Patient reports progressively worsening vaginal irritation x3 days. States she was started on antibiotics but stopped taking them for vaginal irritation. Has 3 pills left. No vaginal discharge. Very dry. Feels irritated. "Feels like a whole lot of fire ants are down there". Reports this happened when she was pregnant with her son when she had chlamydia. No lesions, rashes, sores. Sexually active with same partner. No leakage of fluid or bleeding. Did note small amount of white discharge, thin. Patient is ~8w pregnant by [place].   PERTINENT  PMH / PSH: pregnancy  OBJECTIVE:   BP 100/80   Pulse 79   Ht 5\' 2"  (1.575 m)   Wt 108 lb (49 kg)   LMP 09/18/2019 (Exact Date)   SpO2 100%   BMI 19.75 kg/m   Female genitalia: normal external genitalia, vulva, vagina, cervix, uterus and adnexa. Tenderness with speculum and bimanual exam. Thick white discharge noted. Chaperone present    ASSESSMENT/PLAN:   Vaginal yeast infection Patient symptoms consistent with vaginal yeast infection.  Likely from recent antibiotic use.  Advised her to continue the antibiotics to help clear her UTI as this can cause detrimental effects in her pregnancy.  We will send in miconazole cream to the pharmacy for patient.  This was previously sent yesterday.  Advised to follow-up if no improvement.  Of note patient does have an upcoming OB/GYN appointment so can follow-up at that time.  We will also obtain gonorrhea and Chlamydia testing today.     11/18/2019, DO [facility]

## 2019-11-16 NOTE — Telephone Encounter (Signed)
Appointment made.  Patient will be seen 11/16/2019 at 1410.  Glennie Hawk, CMA

## 2019-11-16 NOTE — Assessment & Plan Note (Signed)
Patient symptoms consistent with vaginal yeast infection.  Likely from recent antibiotic use.  Advised her to continue the antibiotics to help clear her UTI as this can cause detrimental effects in her pregnancy.  We will send in miconazole cream to the pharmacy for patient.  This was previously sent yesterday.  Advised to follow-up if no improvement.  Of note patient does have an upcoming OB/GYN appointment so can follow-up at that time.  We will also obtain gonorrhea and Chlamydia testing today.

## 2019-11-17 LAB — CERVICOVAGINAL ANCILLARY ONLY
Chlamydia: NEGATIVE
Comment: NEGATIVE
Comment: NORMAL
Neisseria Gonorrhea: NEGATIVE

## 2019-11-21 ENCOUNTER — Ambulatory Visit (INDEPENDENT_AMBULATORY_CARE_PROVIDER_SITE_OTHER): Payer: 59 | Admitting: Family Medicine

## 2019-11-21 ENCOUNTER — Other Ambulatory Visit: Payer: Self-pay

## 2019-11-21 ENCOUNTER — Encounter: Payer: Self-pay | Admitting: Family Medicine

## 2019-11-21 VITALS — BP 98/60 | HR 74 | Wt 108.6 lb

## 2019-11-21 DIAGNOSIS — O0990 Supervision of high risk pregnancy, unspecified, unspecified trimester: Secondary | ICD-10-CM | POA: Insufficient documentation

## 2019-11-21 DIAGNOSIS — Z8759 Personal history of other complications of pregnancy, childbirth and the puerperium: Secondary | ICD-10-CM

## 2019-11-21 DIAGNOSIS — O09521 Supervision of elderly multigravida, first trimester: Secondary | ICD-10-CM | POA: Insufficient documentation

## 2019-11-21 DIAGNOSIS — O0991 Supervision of high risk pregnancy, unspecified, first trimester: Secondary | ICD-10-CM

## 2019-11-21 NOTE — Progress Notes (Signed)
Patient Name: Dana Hart Date of Birth: 1985/11/03 [place] Initial Prenatal Visit  Dana Hart is a 34 y.o. year old G1P0 at Unknown who presents for her initial prenatal visit. Pregnancy is not planned She reports breast tenderness, fatigue, frequent urination, positive home pregnancy test and positive in clinic.  Denies vaginal bleeding, no LOF, pelvic pain.  Not yet feeling baby move. She is taking a prenatal vitamin.   Pregnancy Dating: . The patient is dated by LMP, c/w early [place].  Dana Hart LMP: Patient's last menstrual period was 09/18/2019 (exact date).  Periods were regular and date is exact, making today [redacted]w[redacted]d. . Period is certain:  Yes.  . Periods were regular:  Yes.  Dana Hart LMP was a typical period:  No, was more light. . Using hormonal contraception in 3 months prior to conception: No  Lab Review: . Blood type: O . Rh Status: + . Antibody screen: Negative . HIV: Negative . RPR: Negative . Hemoglobin electrophoresis reviewed: Yes . Results of OB urine culture are: Positive for E coli, was treated with Keflex and completed course except for 1 pill (took QID for 5 days) . Rubella: Immune . Varicella status is Immune, had chicken pox  PMH: Reviewed and as detailed below: . HTN: No  . Type 1 or 2 Diabetes: No  . Depression:  Yes , has a therapist . Seizure disorder:  No . VTE: No ,  . History of STI No,  . Abnormal Pap smear:  No, . Genital herpes simplex:  No   PSH: . Gynecologic Surgery:  2 LTCS . Surgical history reviewed, notable for: LTCS x2  Obstetric History: . Obstetric history tab updated and reviewed.  . Summary of prior pregnancies:   Pregnancy 1 - Abortion at age 88, medical Pregnancy 2 - normal pregnancy, uncomplicated C/S for fetal bradycardia and intolerant of labor with pitocin augmentation, LTCS Pregnancy 3- Failed TOLAC due to failure to progress, stopped dilating around 5-6 cm  . Cesarean delivery: Yes  . Gestational  Diabetes:  No . Hypertension in pregnancy: No . History of preterm birth: No . History of LGA/SGA infant:  No . History of shoulder dystocia: No . Indications for referral were reviewed, and the patient has no obstetric indications for referral to [facility] at this time.   Social History: . Partner's name: Dana Hart  . Tobacco use: No . Alcohol use:  No . Other substance use:  No  Current Medications:  . Is prescribed Buspar, by psychiatrist, but no longer wants to be on it, only took it for 2 weeks and has been off for 1 week, made her feel tired  . Does not like her psychiatrist and is not planning to follow up . Taking a prenatal vitamin . Reviewed and appropriate in pregnancy.   Genetic and Infection Screen: . Flow Sheet Updated Yes  Prenatal Exam: Gen: Well nourished, well developed.  No distress.  Vitals noted. HEENT: Normocephalic, atraumatic.  Neck supple without cervical lymphadenopathy, thyromegaly or thyroid nodules.  Fair dentition. CV: RRR no murmur, gallops or rubs Lungs: CTA B.  Normal respiratory effort without wheezes or rales. Abd: soft, NTND. +BS.  Uterus not appreciated above pelvis. GU: Not examined: patient had vaginal exam with G/C on  Ext: No clubbing, cyanosis or edema. Psych: Normal grooming and dress.  Not depressed or anxious appearing.  Normal thought content and process without flight of ideas or looseness of associations  Fetal heart tones: not  attempted due to gestational age*  Assessment/Plan:  Dana Hart is a 34 y.o. G1P0 at Unknown who presents to initiate prenatal care. She is doing well.  Current pregnancy issues include prior pregnancy SGA and AMA at time of delivery.  1. Routine prenatal care: Dana Hart As dating is reliable, a dating ultrasound has been ordered. Dating tab updated. . Pre-pregnancy weight updated. Expected weight gain this pregnancy is 25-35 pounds  . Prenatal labs reviewed, notable for nothing. . Indications for  referral to [facility] were reviewed and the patient does meet criteria for referral.  . Medication list reviewed and updated.  . Recommended patient see a dentist for regular care.  . Bleeding and pain precautions reviewed. . Importance of prenatal vitamins reviewed.  . Genetic screening offered. Patient opted for: no screening. . The patient will be age 26 or over at time of delivery. Referral to genetic counseling was offered today. She declined. . The patient has the following risk factors for preexisting diabetes: Reviewed indications for early 1 hour glucose testing, not indicated . An early 1 hour glucose tolerance test was not ordered. . Pregnancy Medical Home and PHQ-9 forms completed, problems noted: Yes  . Plans for infant to be circumcised if female, will [facility]  2. History of SGA infant . [facility]  3. AMA  Will be 34 at time of delivery  Referred to [facility]  Declines genetic testing and genetics referral  4. History of 2 prior LTCS  Is considering TOLAC  5. Vertigo  Takes meclizine prn  Meclizine safe for pregnancy per uptodate  6. Bipolar 2 Disorder  PHQ-9 score 15, denies SI  Currently declines medication  Follows with psychiatry and therapy  7. Asymptomatic Bacteruria  Completed Keflex  Obtained OB urine culture today   Follow up 4 weeks for next prenatal visit at [facility].  Referral sent to OB.

## 2019-11-21 NOTE — Patient Instructions (Signed)
Thank you for coming to see me today. It was a pleasure. Today we talked about:   I have placed a referral to [facility] for high risk care given your age and your prior small baby.  If you do not hear from them in the next 2 weeks, please give [place] a call.  If you have any questions or concerns, please do not hesitate to call the office at [phone].  Best,   Luis Abed, DO   First Trimester of Pregnancy  The first trimester of pregnancy is from week 1 until the end of week 13 (months 1 through 3). During this time, your baby will begin to develop inside you. At 6-8 weeks, the eyes and face are formed, and the heartbeat can be seen on ultrasound. At the end of 12 weeks, all the baby's organs are formed. Prenatal care is all the medical care you receive before the birth of your baby. Make sure you get good prenatal care and follow all of your doctor's instructions. Follow these instructions at home: Medicines  Take over-the-counter and prescription medicines only as told by your doctor. Some medicines are safe and some medicines are not safe during pregnancy.  Take a prenatal vitamin that contains at least 600 micrograms (mcg) of folic acid.  If you have trouble pooping (constipation), take medicine that will make your stool soft (stool softener) if your doctor approves. Eating and drinking   Eat regular, healthy meals.  Your doctor will tell you the amount of weight gain that is right for you.  Avoid raw meat and uncooked cheese.  If you feel sick to your stomach (nauseous) or throw up (vomit): ? Eat 4 or 5 small meals a day instead of 3 large meals. ? Try eating a few soda crackers. ? Drink liquids between meals instead of during meals.  To prevent constipation: ? Eat foods that are high in fiber, like fresh fruits and vegetables, whole grains, and beans. ? Drink enough fluids to keep your pee (urine) clear or pale yellow. Activity  Exercise only as told by  your doctor. Stop exercising if you have cramps or pain in your lower belly (abdomen) or low back.  Do not exercise if it is too hot, too humid, or if you are in a place of great height (high altitude).  Try to avoid standing for long periods of time. Move your legs often if you must stand in one place for a long time.  Avoid heavy lifting.  Wear low-heeled shoes. Sit and stand up straight.  You can have sex unless your doctor tells you not to. Relieving pain and discomfort  Wear a good support bra if your breasts are sore.  Take warm water baths (sitz baths) to soothe pain or discomfort caused by hemorrhoids. Use hemorrhoid cream if your doctor says it is okay.  Rest with your legs raised if you have leg cramps or low back pain.  If you have puffy, bulging veins (varicose veins) in your legs: ? Wear support hose or compression stockings as told by your doctor. ? Raise (elevate) your feet for 15 minutes, 3-4 times a day. ? Limit salt in your food. Prenatal care  Schedule your prenatal visits by the twelfth week of pregnancy.  Write down your questions. Take them to your prenatal visits.  Keep all your prenatal visits as told by your doctor. This is important. Safety  Wear your seat belt at all times when driving.  Make a list  of emergency phone numbers. The list should include numbers for family, friends, the hospital, and police and fire departments. General instructions  Ask your doctor for a referral to a local prenatal class. Begin classes no later than at the start of month 6 of your pregnancy.  Ask for help if you need counseling or if you need help with nutrition. Your doctor can give you advice or tell you where to go for help.  Do not use hot tubs, steam rooms, or saunas.  Do not douche or use tampons or scented sanitary pads.  Do not cross your legs for long periods of time.  Avoid all herbs and alcohol. Avoid drugs that are not approved by your doctor.  Do  not use any tobacco products, including cigarettes, chewing tobacco, and electronic cigarettes. If you need help quitting, ask your doctor. You may get counseling or other support to help you quit.  Avoid cat litter boxes and soil used by cats. These carry germs that can cause birth defects in the baby and can cause a loss of your baby (miscarriage) or stillbirth.  Visit your dentist. At home, brush your teeth with a soft toothbrush. Be gentle when you floss. Contact a doctor if:  You are dizzy.  You have mild cramps or pressure in your lower belly.  You have a nagging pain in your belly area.  You continue to feel sick to your stomach, you throw up, or you have watery poop (diarrhea).  You have a bad smelling fluid coming from your vagina.  You have pain when you pee (urinate).  You have increased puffiness (swelling) in your face, hands, legs, or ankles. Get help right away if:  You have a fever.  You are leaking fluid from your vagina.  You have spotting or bleeding from your vagina.  You have very bad belly cramping or pain.  You gain or lose weight rapidly.  You throw up blood. It may look like coffee grounds.  You are around people who have [place] measles, fifth disease, or chickenpox.  You have a very bad headache.  You have shortness of breath.  You have any kind of trauma, such as from a fall or a car accident. Summary  The first trimester of pregnancy is from week 1 until the end of week 13 (months 1 through 3).  To take care of yourself and your unborn baby, you will need to eat healthy meals, take medicines only if your doctor tells you to do so, and do activities that are safe for you and your baby.  Keep all follow-up visits as told by your doctor. This is important as your doctor will have to ensure that your baby is healthy and growing well. This information is not intended to replace advice given to you by your health care provider. Make sure you  discuss any questions you have with your health care provider. Document Revised: 09/23/2018 Document Reviewed: 06/10/2016 Elsevier Patient Education  2020 [employer].

## 2019-11-24 ENCOUNTER — Telehealth: Payer: Self-pay

## 2019-11-24 NOTE — Telephone Encounter (Signed)
If patient is still having irritation she needs to be seen in clinic. Can be with any provider. If yeast treatment did not help then the cause may not be just from her yeast infection.  Orpah Clinton, PGY-3 [facility] 11/24/2019 2:38 PM

## 2019-11-24 NOTE — Telephone Encounter (Signed)
Patient calls nurse line stating she has completed vaginal inserts for yeast infection, however feels no better. Patient reports she actually feels more irritated. Patient reports she believes she had this medication in the past and did not have any reactions. I advised patient there are not too many things we can given for yeast in pregnancy. However, I forward to provider who saw patient.

## 2019-11-25 NOTE — Telephone Encounter (Signed)
Patient contacted and advised to make a FU apt. Patient states she is no better and can barely sit down due to discomfort. Patient reports she is not having really any discharge, just very swollen. Patient states she does not see any ulcers or anything else abnormal, except swelling. Patient denies fevers or chills. Patient reports she has missed 2 days of work due to problem. Patient scheduled for Monday, first available. Patient advised UC visit as well.

## 2019-11-28 ENCOUNTER — Ambulatory Visit (INDEPENDENT_AMBULATORY_CARE_PROVIDER_SITE_OTHER): Payer: 59 | Admitting: Student in an Organized Health Care Education/Training Program

## 2019-11-28 ENCOUNTER — Other Ambulatory Visit: Payer: Self-pay

## 2019-11-28 ENCOUNTER — Ambulatory Visit (HOSPITAL_COMMUNITY)
Admission: EM | Admit: 2019-11-28 | Discharge: 2019-11-28 | Disposition: A | Discharge status: Home / Self Care | Payer: 59

## 2019-11-28 ENCOUNTER — Ambulatory Visit: Payer: 59 | Admitting: Family Medicine

## 2019-11-28 VITALS — BP 122/62 | HR 79 | Ht 62.0 in | Wt 109.8 lb

## 2019-11-28 DIAGNOSIS — O0991 Supervision of high risk pregnancy, unspecified, first trimester: Secondary | ICD-10-CM

## 2019-11-28 DIAGNOSIS — N761 Subacute and chronic vaginitis: Secondary | ICD-10-CM

## 2019-11-28 DIAGNOSIS — N898 Other specified noninflammatory disorders of vagina: Secondary | ICD-10-CM | POA: Diagnosis not present

## 2019-11-28 DIAGNOSIS — R4589 Other symptoms and signs involving emotional state: Secondary | ICD-10-CM

## 2019-11-28 DIAGNOSIS — N760 Acute vaginitis: Secondary | ICD-10-CM | POA: Insufficient documentation

## 2019-11-28 LAB — POCT WET PREP (WET MOUNT)
Clue Cells Wet Prep Whiff POC: NEGATIVE
Trichomonas Wet Prep HPF POC: ABSENT

## 2019-11-28 LAB — POCT URINALYSIS DIP (MANUAL ENTRY)
Bilirubin, UA: NEGATIVE
Glucose, UA: NEGATIVE mg/dL
Ketones, POC UA: NEGATIVE mg/dL
Leukocytes, UA: NEGATIVE
Nitrite, UA: NEGATIVE
Protein Ur, POC: NEGATIVE mg/dL
Spec Grav, UA: 1.025 (ref 1.010–1.025)
Urobilinogen, UA: 0.2 E.U./dL
pH, UA: 7 (ref 5.0–8.0)

## 2019-11-28 LAB — POCT UA - MICROSCOPIC ONLY

## 2019-11-28 NOTE — Assessment & Plan Note (Signed)
Pelvic exam and wet prep negative for abnormalities. Urinalysis repeated due to incompletion of treatment which was also negative Recommend decreasing bathing as this could be contributing to worsening symptoms.

## 2019-11-28 NOTE — Progress Notes (Signed)
Dr. Dareen Piano consulted me for further [facility] assessment   S: Pt reported she has been emotionally struggling with her pregnancy and says it has not been a good time for her to be pregnant.  Pt stated she has felt isolated because everyone is happy for her pregnancy.    Pt reported she has thoughts of suicide with plan.  Pt reported she has thought about throwing herself down the stairs.  When inquiring about intent, pt stated "she doesn't think she would do it". Pt stated her daughter has been a protective factor for her.  Pt reported hx of bipolar and is currently off medication.  Pt stated she does not like psychiatrist. Pt reported that she has times of feeling manic and depression. Pt scored positive on MDQ and fit for criteria of Bipolar.    Pt gave consent for grandmother to be involved in treatment and BH and pt shared treatment plan with grandmother. Pt agreed to go to [facility] for further assessment.  O: Pt was tearful during assessment.  Pt was engaged and oriented x4.   A: Pt is currently experiencing depressive state brought on by life circumstances and would benefit from further psychiatric care.   P: Pt agreed to go to [facility] with grandmother who was present to drive her for further assessment.  Ava Elisabeth Most was contacted for further collaborative care.   Suicide hotline was given to pt.   Pt to f/u with Dr. Shawnee Knapp 6/18 for therapy.  Royetta Asal, PhD., LMFT-A

## 2019-11-28 NOTE — Assessment & Plan Note (Signed)
Given acutely worsening PHQ-9 in pregnant patient endorsing suicidal thoughts and plan, arranged for patient to voluntarily go to [facility] ED at [facility] long and escorted by her grandmother after being seen by our in-house psychologist. Dr. Shawnee Knapp will be following up later this week.

## 2019-11-28 NOTE — BH Assessment (Signed)
Comprehensive Clinical Assessment (CCA) Screening, Triage and Referral Note  11/28/2019 JENNALYNN RIVARD [id number]  Visit Diagnosis: Bipolar Disorder  Patient Reported Information How did you hear about [place]? Primary Care   Referral name: [facility]   Referral phone number: No data recorded Whom do you see for routine medical problems? Primary Care (Pt planning to change providers.)   Practice/Facility Name: [facility]   Practice/Facility Phone Number: [phone]   Name of Contact: Dr. Doloris Hall Number: same   Contact Fax Number: unknown   Prescriber Name: Dr. Manfred Arch   Prescriber Address (if known): [address], [place]  What Is the Reason for Your Visit/Call Today? Referred by PCP, increased depression, SI  How Long Has This Been Causing You Problems? 1 wk - 1 month  Have You Recently Been in Any Inpatient Treatment (Hospital/Detox/Crisis Center/28-Day Program)? No data recorded  Name/Location of Program/Hospital:No data recorded  How Long Were You There? No data recorded  When Were You Discharged? No data recorded Have You Ever Received Services From [facility] Before? No   Who Do You See at [facility]? No data recorded Have You Recently Had Any Thoughts About Hurting Yourself? Yes (anxiety, hopeless thoughts)   Are You Planning to Commit Suicide/Harm Yourself At This time?  No  Have you Recently Had Thoughts About Hurting Someone Karolee Ohs? No   Explanation: No data recorded Have You Used Any Alcohol or Drugs in the Past 24 Hours? No   How Long Ago Did You Use Drugs or Alcohol?  No data recorded  What Did You Use and How Much? No data recorded What Do You Feel Would Help You the Most Today? Assessment Only;Other (Comment) (Discussion of safe medications during pregnancy)  Do You Currently Have a Therapist/Psychiatrist? Yes   Name of Therapist/Psychiatrist: Dr. Jannifer Franklin   Have You Been Recently Discharged From Any  Office Practice or Programs? No   Explanation of Discharge From Practice/Program:  No data recorded    CCA Screening Triage Referral Assessment Type of Contact: Face-to-Face   Is this Initial or Reassessment? No data recorded  Date Telepsych consult ordered in [facility]:  No data recorded  Time Telepsych consult ordered in [facility]:  No data recorded Patient Reported Information Reviewed? Yes   Patient Left Without Being Seen? No data recorded  Reason for Not Completing Assessment: No data recorded Collateral Involvement: Grandmother is present, supportive, engaged in safety planning  Does Patient Have a Automotive engineer Guardian? No data recorded  Name and Contact of Legal Guardian:  No data recorded If Minor and Not Living with Parent(s), Who has Custody? No data recorded Is CPS involved or ever been involved? Never  Is APS involved or ever been involved? Never  Patient Determined To Be At Risk for Harm To Self or Others Based on Review of Patient Reported Information or Presenting Complaint? No   Method: No data recorded  Availability of Means: No data recorded  Intent: No data recorded  Notification Required: No data recorded  Additional Information for Danger to Others Potential:  No data recorded  Additional Comments for Danger to Others Potential:  No data recorded  Are There Guns or Other Weapons in Your Home?  No data recorded   Types of Guns/Weapons: No data recorded   Are These Weapons Safely Secured?  No data recorded   Who Could Verify You Are Able To Have These Secured:    No data recorded Do You Have any Outstanding Charges, Pending Court Dates, Parole/Probation? No data recorded Contacted To Inform of Risk of Harm To Self or Others: Other: Comment (N\A)  Location of Assessment: [facility] Assessment Services  Does Patient Present under Involuntary Commitment? No   IVC Papers Initial File Date: No data recorded  [place] of Residence:  [place]  Patient Currently Receiving the Following Services: Medication Management;Individual Therapy   Determination of Need: Routine (7 days)   Options For Referral: Outpatient Therapy Patient plans to follow up with current provider, Dr. Darleene Cleaver with [facility].  She has an appt scheduled on 11/30/19.   Fransico Meadow

## 2019-11-28 NOTE — Progress Notes (Signed)
   SUBJECTIVE:   CHIEF COMPLAINT / HPI: vaginal irritation  Vaginal irritation- patient had positive urinalysis for e coli which was treated with keflex. Patient did not complete the course (1 pill remaining) due to initiation of vaginal irritation and thought the medication was causing it. She was seen and treated for yeast infection with topical antifungal as she is pregnant. She also soaks in apple cider vinegar tub 2-3 times per day which is helping her symptoms but getting worse overall. Denies painful intercourse.  Depression- endorses feelings of failure and depression due to unexpected pregnancy. Feels unsupported in this feeling as everyone else, including her husband is happy about the pregnancy so she can't discuss her disappointment with anyone. Has struggled with depression and bipolar in the past but is not on any medication currently. Depression worsening. PHQ-9 score increased from 15 to 23 today and she increased to 1 on question 9. She endorsed to our psychologist that she has suicidal ideations and has a plan. Described to her that her older daughter is her motivation to stay alive. She is tearful during our encounter. She is open to and motivated for receiving help in form of counseling and medication. Does not desire termination of pregnancy. Denies active suicidal ideas or plans to me.  She has a strong family history of mental health disorders and is familiar in helping her family members seek help.  PERTINENT  PMH / PSH: bipolar  OBJECTIVE:   BP 122/62   Pulse 79   Ht 5\' 2"  (1.575 m)   Wt 109 lb 12.8 oz (49.8 kg)   LMP 09/18/2019 (Exact Date)   SpO2 99%   BMI 20.08 kg/m   General: NAD, pleasant, able to participate in exam Pelvic: negative for external abnormalities. Extreme tenderness with speculum insertion. Difficulty with insertion of speculum. Very mild clear/white discharge. Extremities: no edema or cyanosis. WWP. Skin: warm and dry, no rashes noted Neuro:  alert and oriented x4, no focal deficits Psych: depressed/tearful affect and mood  ASSESSMENT/PLAN:   Vaginitis Pelvic exam and wet prep negative for abnormalities. Urinalysis repeated due to incompletion of treatment which was also negative Recommend decreasing bathing as this could be contributing to worsening symptoms.   Suicide risk Given acutely worsening PHQ-9 in pregnant patient endorsing suicidal thoughts and plan, arranged for patient to voluntarily go to [facility] ED at [facility] long and escorted by her grandmother after being seen by our in-house psychologist. Dr. [facility] will be following up later this week.    Supervision of high-risk pregnancy Patient will need regular prenatal follow up  She requests FMLA paperwork     Shawnee Knapp, DO [facility]

## 2019-11-28 NOTE — Assessment & Plan Note (Signed)
Patient will need regular prenatal follow up  She requests FMLA paperwork

## 2019-11-28 NOTE — ED Provider Notes (Addendum)
Behavioral Health Medical Screening Exam  Dana Hart is a 34 y.o. female referred to [facility] for psychiatric evaluation related to complaints of suicidal ideation.  Patient presents to [facility] accompanied by her grandmother with complaints of excessive worry over her pregnancy.  Patient state that she is 3 months pregnant and 34 yrs old.  She was told by her OB/GYN that she would be referred to a high risk and no one has talked to her or explained what is going on.  States that she has outpatient psychiatric services but provider had to leave on a family emergency and the medications that she was taking did not know if contraindicated during pregnancy so stopped taking her medications.  Patient states that she has an appointment with psychiatrist 11/28/2019; encouraged to discuss medications. One of the medications is Buspar which we discussed and informed it was okay to start and to take during pregnancy.  Patient also encouraged to call OB/GYN to see if she could come back in for a consult and discuss high risk factors and why being referred to a high risk provider.  Patient states that she does not want to harm or kill herself and stated that she felt safe to go home.  Patient gave permission to speak to her grandmother for collateral information.  Patients grandmother also states that she feels that the patient is safe to come home and if there were any concerns that she would bring the patient back.    Total Time spent with patient: 30 minutes  Psychiatric Specialty Exam  Presentation  General Appearance: No data recorded Eye Contact:Good  Speech:Clear and Coherent;Normal Rate  Speech Volume:Normal  Handedness:Right   Mood and Affect  Mood:Anxious  Affect:Appropriate;Congruent   Thought Process  Thought Processes:Coherent;Goal Directed  Descriptions of Associations:Intact  Orientation:Full (Time, Place and Person)  Thought Content:WDL  Hallucinations:Hallucinations: None  Ideas  of Reference:None  Suicidal Thoughts:Suicidal Thoughts: No  Homicidal Thoughts:No data recorded  Sensorium  Memory:Immediate Good;Recent Good;Remote Good  Judgment:Good  Insight:Good   Executive Functions  Concentration:Good  Attention Span:Good  Recall:Good  Fund of Knowledge:Good  Language:Good   Psychomotor Activity  Psychomotor Activity:Psychomotor Activity: Normal   Assets  Assets:Communication Skills;Desire for Improvement;Housing;Financial Resources/Insurance;Intimacy;Physical Health;Social Support;Transportation   Sleep  Sleep:Sleep: Good   Physical Exam: Physical Exam Constitutional:      Appearance: Normal appearance.  Pulmonary:     Effort: Pulmonary effort is normal.  Musculoskeletal:        General: Normal range of motion.  Skin:    General: Skin is warm and dry.  Neurological:     Mental Status: She is alert.  Psychiatric:        Attention and Perception: Attention and perception normal.        Mood and Affect: Mood and affect normal.        Speech: Speech normal.        Behavior: Behavior normal. Behavior is cooperative.        Thought Content: Thought content normal.        Cognition and Memory: Cognition and memory normal.        Judgment: Judgment normal.    Review of Systems  Psychiatric/Behavioral: Negative for depression, hallucinations, memory loss, substance abuse and suicidal ideas. The patient is nervous/anxious.        Patient complains of increased stress related to pregnancy and no one giving her information other than it is high risk.   All other systems reviewed and are negative.  Blood  pressure 113/61, pulse 73, temperature 98.1 F (36.7 C), temperature source Oral, resp. rate 16, height 5\' 2"  (1.575 m), weight 110 lb (49.9 kg), last menstrual period 09/18/2019, SpO2 100 %, unknown if currently breastfeeding. Body mass index is 20.12 kg/m.  Musculoskeletal: Strength & Muscle Tone: within normal limits Gait &  Station: normal Patient leans: N/A   Recommendations:  Keep scheduled appointment with primary psychiatric provider  Based on my evaluation the patient does not appear to have an emergency medical condition.  Disposition:  Psychiatrically cleared No evidence of imminent risk to self or others at present.   Patient does not meet criteria for psychiatric inpatient admission. Supportive therapy provided about ongoing stressors. Discussed crisis plan, support from social network, calling 911, coming to the Emergency Department, and calling Suicide Hotline.  Xxavier Noon, NP 11/28/2019, 1:59 PM

## 2019-11-29 ENCOUNTER — Ambulatory Visit (HOSPITAL_COMMUNITY): Payer: Self-pay | Admitting: Licensed Clinical Social Worker

## 2019-12-02 ENCOUNTER — Other Ambulatory Visit: Payer: Self-pay

## 2019-12-02 ENCOUNTER — Ambulatory Visit (INDEPENDENT_AMBULATORY_CARE_PROVIDER_SITE_OTHER): Payer: PRIVATE HEALTH INSURANCE | Admitting: Psychology

## 2019-12-02 DIAGNOSIS — F3181 Bipolar II disorder: Secondary | ICD-10-CM

## 2019-12-02 NOTE — BH Specialist Note (Signed)
Integrated Behavioral Health Visit   12/02/2019 Dana Hart [id number]   Session Start time: 9 Session End time: 945 Total time: 45   Referring Provider: Dr. Dareen Piano Pt consented to resident shadowing appt.  PRESENTING CONCERNS: Patient and/or family reports the following symptoms/concerns: Pt presented today with grandmother. Pt shared her mood has improved since last visit.  Pt was tearful and engaged during visit.    Pt shared she feels lots of stress to care take for her mother and has struggled with relationship with father. Pt and Dr. Shawnee Knapp with grandmother discussed putting herself first and what that means for self-care and compassion.  Duration of problem: past month; Severity of problem: moderate  STRENGTHS (Protective Factors/Coping Skills): Family and insight   GOALS ADDRESSED: Patient will: 1.  Reduce symptoms of: depression : pt has hx of suicidal thoughts, no current; pt has had syx of feeling overwhelmed, sad and stressed  2.  Increase knowledge and/or ability of: self-management skills : to be able to recognize boundaries in giving to others and create self-compassion for self. 3.  Demonstrate ability to: Increase healthy adjustment to current life circumstances  INTERVENTIONS: Interventions utilized:  Supportive Counseling and family therapy  Standardized Assessments completed: Not Needed  ASSESSMENT: Patient currently experiencing depressive episode due to current adjustments in life. Pt has hx of bipolar.   Patient may benefit from continued supportive therapy and family support.  PLAN: 1. Follow up with behavioral health clinician on : therapy f/u 2. Behavioral recommendations: continued self-care and self-compassion 3. Referral(s): Integrated [employer] (In Clinic)  Royetta Asal, PhD., LMFT-A

## 2019-12-07 ENCOUNTER — Ambulatory Visit (INDEPENDENT_AMBULATORY_CARE_PROVIDER_SITE_OTHER): Payer: 59

## 2019-12-07 ENCOUNTER — Other Ambulatory Visit: Payer: Self-pay

## 2019-12-07 VITALS — BP 96/65 | HR 73 | Wt 111.4 lb

## 2019-12-07 DIAGNOSIS — Z3491 Encounter for supervision of normal pregnancy, unspecified, first trimester: Secondary | ICD-10-CM

## 2019-12-07 DIAGNOSIS — Z3490 Encounter for supervision of normal pregnancy, unspecified, unspecified trimester: Secondary | ICD-10-CM

## 2019-12-07 DIAGNOSIS — O3680X0 Pregnancy with inconclusive fetal viability, not applicable or unspecified: Secondary | ICD-10-CM | POA: Diagnosis not present

## 2019-12-07 DIAGNOSIS — Z3A11 11 weeks gestation of pregnancy: Secondary | ICD-10-CM

## 2019-12-07 MED ORDER — VITAFOL GUMMIES 3.33-0.333-34.8 MG PO CHEW
1.0000 | CHEWABLE_TABLET | Freq: Every day | ORAL | 6 refills | Status: AC
Start: 2019-12-07 — End: ?

## 2019-12-07 MED ORDER — BLOOD PRESSURE KIT DEVI: 1.0000 | Freq: Every day | 0 refills | Status: DC

## 2019-12-07 NOTE — Progress Notes (Signed)
PRENATAL INTAKE SUMMARY  Ms. Cephas presents today New OB Nurse Interview.  OB History    Gravida  4   Para  2   Term  2   Preterm  0   AB  1   Living  2     SAB      TAB      Ectopic      Multiple      Live Births  2        Obstetric Comments  Abortion at age 34, medical       I have reviewed the patient's medical, obstetrical, social, and family histories, medications, and available lab results.  SUBJECTIVE She has no unusual complaints  OBJECTIVE Initial Physical Exam (New OB)  GENERAL APPEARANCE: alert, well appearing   ASSESSMENT Normal pregnancy  PLAN Prenatal care OB Pnl/HIV  OB Urine Culture GC/CT HgbEval SMA CF A1C Glucose   If CHTN - P/C Ratio and CMP   PHQ-9 score is a 16. Patient is already seeing a counselor  Single Live Intrauterine Pregnancy FHR 161

## 2019-12-09 ENCOUNTER — Telehealth: Payer: Self-pay | Admitting: Student in an Organized Health Care Education/Training Program

## 2019-12-09 ENCOUNTER — Other Ambulatory Visit: Payer: Self-pay

## 2019-12-09 ENCOUNTER — Telehealth: Payer: Self-pay | Admitting: Psychology

## 2019-12-09 ENCOUNTER — Ambulatory Visit (INDEPENDENT_AMBULATORY_CARE_PROVIDER_SITE_OTHER): Payer: 59 | Admitting: Psychology

## 2019-12-09 DIAGNOSIS — F3181 Bipolar II disorder: Secondary | ICD-10-CM

## 2019-12-09 NOTE — Telephone Encounter (Signed)
Pt here for appt. inquired about FMLA paperwork that submitted; per pt. two or three weeks ago

## 2019-12-09 NOTE — BH Specialist Note (Signed)
Integrated Behavioral Health Visit   12/09/2019 KAILENE STEINHART [id number]   Session Start time: 9  Session End time: 945 Total time: 45   Referring Provider: Dr. Dareen Piano    PRESENTING CONCERNS: Patient and/or family reports the following symptoms/concerns: Pt reported stress related to her pregnancy and understanding why it was high risk.  Pt shared her previous traumas and how they impact her current experiences.  Pt and Dr. Shawnee Knapp discussed how to best advocate for herself and needs during this stressful time.   Pt denied SI.   Duration of problem: past month; Severity of problem: moderate    STRENGTHS (Protective Factors/Coping Skills): Family and insight   GOALS ADDRESSED: Patient will: 1.  Reduce symptoms of: depression : pt has hx of suicidal thoughts, no current; pt has had syx of feeling overwhelmed, sad and stressed  2.  Increase knowledge and/or ability of: self-management skills : to be able to recognize boundaries in giving to others and create self-compassion for self. 3.  Demonstrate ability to: Increase healthy adjustment to current life circumstances  INTERVENTIONS: Interventions utilized:  Supportive Counseling and family therapy  Standardized Assessments completed: Not Needed  ASSESSMENT: Patient currently experiencing depressive episode due to current adjustments in life. Pt has hx of bipolar.   Patient may benefit from continued supportive therapy and family support.  PLAN: 1. Follow up with behavioral health clinician on : therapy f/u 2. Behavioral recommendations: continued self-care and self-compassion 3. Referral(s): [facility] [employer] (In Clinic)   Royetta Asal, PhD., LMFT-A

## 2019-12-14 ENCOUNTER — Other Ambulatory Visit: Payer: Self-pay

## 2019-12-14 ENCOUNTER — Encounter: Payer: 59 | Admitting: Obstetrics & Gynecology

## 2019-12-14 ENCOUNTER — Ambulatory Visit (INDEPENDENT_AMBULATORY_CARE_PROVIDER_SITE_OTHER): Payer: 59 | Admitting: Obstetrics and Gynecology

## 2019-12-14 VITALS — BP 108/69 | HR 88 | Wt 110.0 lb

## 2019-12-14 DIAGNOSIS — Z3A12 12 weeks gestation of pregnancy: Secondary | ICD-10-CM

## 2019-12-14 DIAGNOSIS — O99341 Other mental disorders complicating pregnancy, first trimester: Secondary | ICD-10-CM

## 2019-12-14 DIAGNOSIS — O0990 Supervision of high risk pregnancy, unspecified, unspecified trimester: Secondary | ICD-10-CM

## 2019-12-14 DIAGNOSIS — F3181 Bipolar II disorder: Secondary | ICD-10-CM

## 2019-12-14 DIAGNOSIS — Z3490 Encounter for supervision of normal pregnancy, unspecified, unspecified trimester: Secondary | ICD-10-CM

## 2019-12-14 DIAGNOSIS — F32A Depression, unspecified: Secondary | ICD-10-CM

## 2019-12-14 DIAGNOSIS — Z8759 Personal history of other complications of pregnancy, childbirth and the puerperium: Secondary | ICD-10-CM

## 2019-12-14 DIAGNOSIS — F329 Major depressive disorder, single episode, unspecified: Secondary | ICD-10-CM

## 2019-12-14 DIAGNOSIS — Z3492 Encounter for supervision of normal pregnancy, unspecified, second trimester: Secondary | ICD-10-CM

## 2019-12-14 NOTE — Patient Instructions (Signed)
First Trimester of Pregnancy  The first trimester of pregnancy is from week 1 until the end of week 13 (months 1 through 3). During this time, your baby will begin to develop inside you. At 6-8 weeks, the eyes and face are formed, and the heartbeat can be seen on ultrasound. At the end of 12 weeks, all the baby's organs are formed. Prenatal care is all the medical care you receive before the birth of your baby. Make sure you get good prenatal care and follow all of your doctor's instructions. Follow these instructions at home: Medicines  Take over-the-counter and prescription medicines only as told by your doctor. Some medicines are safe and some medicines are not safe during pregnancy.  Take a prenatal vitamin that contains at least 600 micrograms (mcg) of folic acid.  If you have trouble pooping (constipation), take medicine that will make your stool soft (stool softener) if your doctor approves. Eating and drinking   Eat regular, healthy meals.  Your doctor will tell you the amount of weight gain that is right for you.  Avoid raw meat and uncooked cheese.  If you feel sick to your stomach (nauseous) or throw up (vomit): ? Eat 4 or 5 small meals a day instead of 3 large meals. ? Try eating a few soda crackers. ? Drink liquids between meals instead of during meals.  To prevent constipation: ? Eat foods that are high in fiber, like fresh fruits and vegetables, whole grains, and beans. ? Drink enough fluids to keep your pee (urine) clear or pale yellow. Activity  Exercise only as told by your doctor. Stop exercising if you have cramps or pain in your lower belly (abdomen) or low back.  Do not exercise if it is too hot, too humid, or if you are in a place of great height (high altitude).  Try to avoid standing for long periods of time. Move your legs often if you must stand in one place for a long time.  Avoid heavy lifting.  Wear low-heeled shoes. Sit and stand up  straight.  You can have sex unless your doctor tells you not to. Relieving pain and discomfort  Wear a good support bra if your breasts are sore.  Take warm water baths (sitz baths) to soothe pain or discomfort caused by hemorrhoids. Use hemorrhoid cream if your doctor says it is okay.  Rest with your legs raised if you have leg cramps or low back pain.  If you have puffy, bulging veins (varicose veins) in your legs: ? Wear support hose or compression stockings as told by your doctor. ? Raise (elevate) your feet for 15 minutes, 3-4 times a day. ? Limit salt in your food. Prenatal care  Schedule your prenatal visits by the twelfth week of pregnancy.  Write down your questions. Take them to your prenatal visits.  Keep all your prenatal visits as told by your doctor. This is important. Safety  Wear your seat belt at all times when driving.  Make a list of emergency phone numbers. The list should include numbers for family, friends, the hospital, and police and fire departments. General instructions  Ask your doctor for a referral to a local prenatal class. Begin classes no later than at the start of month 6 of your pregnancy.  Ask for help if you need counseling or if you need help with nutrition. Your doctor can give you advice or tell you where to go for help.  Do not use hot tubs, steam   rooms, or saunas.  Do not douche or use tampons or scented sanitary pads.  Do not cross your legs for long periods of time.  Avoid all herbs and alcohol. Avoid drugs that are not approved by your doctor.  Do not use any tobacco products, including cigarettes, chewing tobacco, and electronic cigarettes. If you need help quitting, ask your doctor. You may get counseling or other support to help you quit.  Avoid cat litter boxes and soil used by cats. These carry germs that can cause birth defects in the baby and can cause a loss of your baby (miscarriage) or stillbirth.  Visit your dentist.  At home, brush your teeth with a soft toothbrush. Be gentle when you floss. Contact a doctor if:  You are dizzy.  You have mild cramps or pressure in your lower belly.  You have a nagging pain in your belly area.  You continue to feel sick to your stomach, you throw up, or you have watery poop (diarrhea).  You have a bad smelling fluid coming from your vagina.  You have pain when you pee (urinate).  You have increased puffiness (swelling) in your face, hands, legs, or ankles. Get help right away if:  You have a fever.  You are leaking fluid from your vagina.  You have spotting or bleeding from your vagina.  You have very bad belly cramping or pain.  You gain or lose weight rapidly.  You throw up blood. It may look like coffee grounds.  You are around people who have German measles, fifth disease, or chickenpox.  You have a very bad headache.  You have shortness of breath.  You have any kind of trauma, such as from a fall or a car accident. Summary  The first trimester of pregnancy is from week 1 until the end of week 13 (months 1 through 3).  To take care of yourself and your unborn baby, you will need to eat healthy meals, take medicines only if your doctor tells you to do so, and do activities that are safe for you and your baby.  Keep all follow-up visits as told by your doctor. This is important as your doctor will have to ensure that your baby is healthy and growing well. This information is not intended to replace advice given to you by your health care provider. Make sure you discuss any questions you have with your health care provider. Document Revised: 09/23/2018 Document Reviewed: 06/10/2016 Elsevier Patient Education  2020 Elsevier Inc.  

## 2019-12-14 NOTE — Progress Notes (Signed)
New ROB.  Breast tenderness, fatigue.

## 2019-12-14 NOTE — Progress Notes (Signed)
INITIAL PRENATAL VISIT NOTE  Subjective:  Dana Hart is a 34 y.o. [id number] at 55w3dby 7 w 2 dayLMP being seen today for her initial prenatal visit. This is an unplanned pregnancy. She and partner are reasonably happy with the pregnancy but it was a surprise. She was using no for birth control previously.  She has used an IUD in the past but it was removed. She has an obstetric history significant for cesarean section x 2 and small for gestational age fetus. She has a medical history significant for asthma and depression.  Patient reports no complaints.  Contractions: Not present. Vag. Bleeding: None.   . Denies leaking of fluid.    Past Medical History:  Diagnosis Date  . Asthma 1990   had it as a child but not having any current issues or taking any medicine  . Depression    [facility] but had to stop seeing her due to  loss of insurance    Past Surgical History:  Procedure Laterality Date  . CESAREAN SECTION  2007   birth of son. Heart rate dropped with pitocin. Emergency c section  . CESAREAN SECTION  2010   birth of daughter. Not progressing    OB History  Gravida Para Term Preterm AB Living  _0 0 1 2  SAB TAB Ectopic Multiple Live Births    1     2    # Outcome Date GA Lbr Len/2nd Weight Sex Delivery Anes PTL Lv  4 Current           3 Term 10/28/08 342w0d5 lb 4 oz (2.381 kg) F CS-LTranv EPI N LIV     Complications: Failure to Progress in First Stage  2 Term 06/01/06 3864w0d lb 13 oz (3.544 kg) M CS-LTranv EPI N LIV     Complications: Fetal Intolerance  1 TAB             Obstetric Comments  Abortion at age [age]edical    Social History   Socioeconomic History  . Marital status: Single    Spouse name: Not on file  . Number of children: 2  . Years of education: 12 69 Highest education level: Not on file  Occupational History  . Occupation: stay at home mom    Comment: used to work at  [employer]  . Smoking status: Former Smoker    Quit date: 08/15/2014    Years since quitting: 5.3  . Smokeless tobacco: Never Used  Vaping Use  . Vaping Use: Never used  Substance and Sexual Activity  . Alcohol use: Not Currently    Alcohol/week: 0.0 standard drinks    Comment: occasional  . Drug use: No  . Sexual activity: Yes    Partners: Male    Birth control/protection: None  Other Topics Concern  . Not on file  Social History Narrative  . Not on file   Social Determinants of Health   Financial Resource Strain:   . Difficulty of Paying Living Expenses:   Food Insecurity:   . Worried About RunCharity fundraiser the Last Year:   . RanArboriculturist the Last Year:   Transportation Needs:   . LacFilm/video editoredical):   . LMarland Kitchenck of Transportation (Non-Medical):   Physical Activity:   . Days of Exercise per Week:   . Minutes  of Exercise per Session:   Stress:   . Feeling of Stress :   Social Connections:   . Frequency of Communication with Friends and Family:   . Frequency of Social Gatherings with Friends and Family:   . Attends Religious Services:   . Active Member of Clubs or Organizations:   . Attends Archivist Meetings:   Marland Kitchen Marital Status:     Family History  Problem Relation Age of Onset  . Mental illness Brother        scyzoaffective disorder  . Asthma Brother   . Depression Mother   . Hypothyroidism Mother   . Hepatitis Mother        hepatitis C  . Fibroids Mother        uterine fibroids  . Hypertension Maternal Grandmother   . Hypertension Maternal Grandfather   . Hypertension Paternal Grandmother   . Hypertension Paternal Grandfather   . Seizures Father        epilepsy  . Hypothyroidism Father   . Depression Father   . Anxiety disorder Father      Current Outpatient Medications:  .  Blood Pressure Monitoring (BLOOD PRESSURE KIT) DEVI, 1 kit by Does not apply route daily., Disp: 1 each, Rfl: 0 .   Prenatal Vit-Fe Phos-FA-Omega (VITAFOL GUMMIES) 3.33-0.333-34.8 MG CHEW, Chew 1 tablet by mouth daily., Disp: 90 tablet, Rfl: 6  Allergies  Allergen Reactions  . Penicillins     Review of Systems: Negative except for what is mentioned in HPI.  Objective:   Vitals:   12/14/19 1508  BP: 108/69  Pulse: 88  Weight: 110 lb (49.9 kg)    Fetal Status: Fetal Heart Rate (bpm): 164         Physical Exam: BP 108/69   Pulse 88   Wt 110 lb (49.9 kg)   LMP 09/18/2019 (Exact Date)   BMI 20.12 kg/m  CONSTITUTIONAL: Well-developed, well-nourished female in no acute distress.  NEUROLOGIC: Alert and oriented to person, place, and time. Normal reflexes, muscle tone coordination. No cranial nerve deficit noted. PSYCHIATRIC: Normal mood and affect. Normal behavior. Normal judgment and thought content. SKIN: Skin is warm and dry. No rash noted. Not diaphoretic. No erythema. No pallor.  Pt previously had physical exam when she started with Family Practice  Assessment and Plan:  Pregnancy: [id number] at 104w3dby ultrasound.  1. Supervision of high risk pregnancy, antepartum Pt will be advanced maternal age at time of delivery.  Prenatal labs reviewed and were WNL.  Hep C and NIPS ordered today.    2. Encounter for supervision of normal pregnancy, antepartum, unspecified gravidity   3. Bipolar 2 disorder ([place] [facility] [place] [facility].  4. Depression during pregnancy in first trimester As above  5. History of prior pregnancy with SGA newborn MFM scan ordered at18-20 weeks   Preterm labor symptoms and general obstetric precautions including but not limited to vaginal bleeding, contractions, leaking of fluid and fetal movement were reviewed in detail with the patient.  Please refer to After Visit Summary for other counseling recommendations.   [facility] about [facility] (around [facility]2020[facility]  LGriffin Basil6/30/2021 4:01 PM

## 2019-12-15 LAB — HEPATITIS C ANTIBODY: Hep C Virus Ab: <0.1 s/co ratio (ref 0.0–0.9)

## 2019-12-16 ENCOUNTER — Telehealth: Payer: Self-pay | Admitting: Student in an Organized Health Care Education/Training Program

## 2019-12-16 ENCOUNTER — Ambulatory Visit (INDEPENDENT_AMBULATORY_CARE_PROVIDER_SITE_OTHER): Payer: 59 | Admitting: Psychology

## 2019-12-16 ENCOUNTER — Other Ambulatory Visit: Payer: Self-pay

## 2019-12-16 DIAGNOSIS — F3181 Bipolar II disorder: Secondary | ICD-10-CM | POA: Diagnosis not present

## 2019-12-16 NOTE — Telephone Encounter (Signed)
Pt is request paperwork for FMLA submitted over a month ago.  Urgent. Ph# [phone]

## 2019-12-16 NOTE — BH Specialist Note (Signed)
  Integrated Behavioral Health Visit   12/09/2019 Dana Hart [id number]   Session Start time: 9  Session End time: 945 Total time: 45   Referring Provider: Dr. Dareen Piano   Pt verbally consented to resident shadowing appt.  PRESENTING CONCERNS: Patient and/or family reports the following symptoms/concerns:   Pt and Pt's grandmother present for the visit.  Pt shared she has a week of ups and downs.  Pt shared her mother had health concerns she had to deal with which were overwhelming to deal with. Pt shared reflection of her mom's mental health and her concerns for herself getting to that place. Pt shared seeing her mom made her motivated to continue to work on herself.   Pt and Dr. Shawnee Knapp engaged in talking through her pressure on herself to do everything and not allowing others to be there for her. Pt's grandmother provided validation and insight.  Pt denied SI.   Duration of problem: past month; Severity of problem: moderate   STRENGTHS (Protective Factors/Coping Skills): Family and insight  GOALS ADDRESSED: Patient will: 1. Reduce symptoms of: depression: pt has hx of suicidal thoughts, no current; pt has had syx of feeling overwhelmed, sad and stressed 2. Increase knowledge and/or ability of: self-management skills: to be able to recognize boundaries in giving to others and create self-compassion for self. 3. Demonstrate ability to: Increase healthy adjustment to current life circumstances  INTERVENTIONS: Interventions utilized:Supportive Counselingand family therapy Standardized Assessments completed:Not Needed  ASSESSMENT: Patient currently experiencingdepressive episode due to current adjustments in life. Pt has hx of bipolar.   Patient may benefit fromcontinued supportive therapy and family support.  PLAN: 1. Follow up with behavioral health clinician on :therapy f/u 2. Behavioral recommendations:continued self-care and  self-compassion 3. Referral(s):[facility] [employer] (In Clinic)   Royetta Asal, PhD., LMFT-A

## 2019-12-19 NOTE — Telephone Encounter (Signed)
Patient is being seen by [facility] office for her pregnancy care. She can pick up her forms to be filled out by that provider. Please let patient know. Thank you.

## 2019-12-20 DIAGNOSIS — Z3493 Encounter for supervision of normal pregnancy, unspecified, third trimester: Secondary | ICD-10-CM

## 2019-12-21 NOTE — Telephone Encounter (Signed)
Patient informed and come to pick up paperwork. Jone Baseman, CMA

## 2019-12-23 ENCOUNTER — Other Ambulatory Visit: Payer: Self-pay

## 2019-12-23 ENCOUNTER — Encounter: Payer: Self-pay | Admitting: Obstetrics and Gynecology

## 2019-12-23 ENCOUNTER — Ambulatory Visit (INDEPENDENT_AMBULATORY_CARE_PROVIDER_SITE_OTHER): Payer: 59 | Admitting: Psychology

## 2019-12-23 DIAGNOSIS — F3181 Bipolar II disorder: Secondary | ICD-10-CM

## 2019-12-23 NOTE — BH Specialist Note (Signed)
Integrated Behavioral Health Visit   12/23/2019 MALYNDA SMOLINSKI [id number]   Session Start time: 10  Session End time: 1050 Total time: 45   Referring Provider: Dr. Dareen Piano    PRESENTING CONCERNS: Patient and/or family reports the following symptoms/concerns: Pt reported frustration and anger today.  Pt shared she is upset because of her FMLA was declined and shared it was not communicated with her about needing OBGYN to fill out forms.  Dr. Shawnee Knapp brought Jone Baseman, Practice Manager in for further planning and not was written for work to explain situation.  Dr. Shawnee Knapp and pt along with pt;s grandmother discussed how this was a trigger event for her because she struggles to allow others to take care of things becauase she has been failed in the past.  Pt and Dr. Shawnee Knapp discussed her anger and emotions underneath it by reviewing anger iceberg.    Pt denied SI.   Duration of problem:past month; Severity of problem:moderate   STRENGTHS (Protective Factors/Coping Skills): Family and insight  GOALS ADDRESSED: Patient will: 1. Reduce symptoms of: depression: pt has hx of suicidal thoughts, no current; pt has had syx of feeling overwhelmed, sad and stressed 2. Increase knowledge and/or ability of: self-management skills: to be able to recognize boundaries in giving to others and create self-compassion for self. 3. Demonstrate ability to: Increase healthy adjustment to current life circumstances  INTERVENTIONS: Interventions utilized:Supportive Counselingand family therapy Standardized Assessments completed:Not Needed  ASSESSMENT: Patient currently experiencingdepressive episode due to current adjustments in life. Pt has hx of bipolar.   Patient may benefit fromcontinued supportive therapy and family support.  PLAN: 1. Follow up with behavioral health clinician on :therapy f/u 2. Behavioral recommendations:continued self-care and  self-compassion 3. Referral(s):[facility] [employer] (In Clinic)   Royetta Asal, PhD., LMFT-A

## 2019-12-23 NOTE — Telephone Encounter (Signed)
Pt is here for an appt today.  She request a letter about the delay in getting her paperwork done.  Spoke with Dr. Leveda Anna, he gives verbal to write the letter. Jone Baseman, CMA

## 2019-12-26 ENCOUNTER — Encounter: Payer: Self-pay | Admitting: Obstetrics & Gynecology

## 2019-12-26 DIAGNOSIS — D563 Thalassemia minor: Secondary | ICD-10-CM

## 2019-12-26 HISTORY — DX: Thalassemia minor: D56.3

## 2019-12-26 NOTE — Telephone Encounter (Signed)
Spoke to patient about the delay in her paperwork completion. I apologized for my part in the delay and poor communication. She was very understanding and gracious.

## 2019-12-27 ENCOUNTER — Telehealth: Payer: Self-pay

## 2019-12-27 NOTE — Telephone Encounter (Signed)
TC to pt to make aware of results pt voiced understanding. Pt given contact info for genetic counseling.

## 2019-12-28 NOTE — Telephone Encounter (Signed)
Spoke with pt on paperwork f/u. Pt stated she has been able to get FMLA filled out with her psychiatrist and OBGYN.  Pt confirmed appt for Friday.

## 2019-12-30 ENCOUNTER — Other Ambulatory Visit: Payer: Self-pay

## 2019-12-30 ENCOUNTER — Ambulatory Visit (INDEPENDENT_AMBULATORY_CARE_PROVIDER_SITE_OTHER): Payer: PRIVATE HEALTH INSURANCE | Admitting: Psychology

## 2019-12-30 DIAGNOSIS — F3181 Bipolar II disorder: Secondary | ICD-10-CM | POA: Diagnosis not present

## 2019-12-30 NOTE — BH Specialist Note (Signed)
Integrated Behavioral Health Visit  12/30/2019 ROSILAND SEN [id number]   Session Start time: 10  Session End time: 1045 Total time: 45   Referring Provider: Dr. Dareen Piano    PRESENTING CONCERNS: Patient and/or family reports the following symptoms/concerns: Pt reported improved mood since she has been off work.  Pt and Dr. Shawnee Knapp dicussed her past trauma and how impact her coping strategies.  Pt was open to reading literature regarding trauma impacts on body for therapeutic education.    Pt denied SI.  Pt reported wanting to bring partner in next week to discuss her trauma. Duration of problem:past month; Severity of problem:moderate   STRENGTHS (Protective Factors/Coping Skills): Family and insight  GOALS ADDRESSED: Patient will: 1. Reduce symptoms of: depression: pt has hx of suicidal thoughts, no current; pt has had syx of feeling overwhelmed, sad and stressed; pt mood improved  2. Increase knowledge and/or ability of: self-management skills: to be able to recognize boundaries in giving to others and create self-compassion for self. 3. Demonstrate ability to: Increase healthy adjustment to current life circumstances  INTERVENTIONS: Interventions utilized:Supportive Counselingand family systems  Standardized Assessments completed:Not Needed  ASSESSMENT: Patient currently experiencingdepressive episode due to current adjustments in life. Pt has hx of bipolar.   Patient may benefit fromcontinued supportive therapy and family support.  PLAN: 1. Follow up with behavioral health clinician on :therapy f/u 2. Behavioral recommendations:continued self-care and self-compassion 3. Referral(s):[facility] [employer] (In Clinic)   Royetta Asal, PhD., LMFT-A

## 2020-01-04 NOTE — Progress Notes (Signed)
Patient ID: Dana Hart, female   DOB: 1985-07-13, 34 y.o.   MRN: [id number] Patient was assessed and managed by nursing staff during this encounter. I have reviewed the chart and agree with the documentation and plan. I have also made any necessary editorial changes.  Scheryl Darter, MD 01/04/2020 1:13 PM

## 2020-01-06 ENCOUNTER — Ambulatory Visit: Payer: 59 | Admitting: Psychology

## 2020-01-06 ENCOUNTER — Other Ambulatory Visit: Payer: Self-pay

## 2020-01-06 DIAGNOSIS — F3181 Bipolar II disorder: Secondary | ICD-10-CM | POA: Diagnosis not present

## 2020-01-06 NOTE — BH Specialist Note (Signed)
Integrated Behavioral Health Visit  01/06/2020 Dana Hart [id number]   Session Start time: 330  Session End time: 415 Total time: 45   Referring Provider: Dr. Dareen Piano PRESENTING CONCERNS: Patient and/or family reports the following symptoms/concerns:  Pt presented today with her husband.  Pt, pt's husband and Dr. Shawnee Knapp discussed pt feelings about her trauma and feeling uncomfortable sharing them. Pt's husband reported he loves her and supports her.  Pt reported she felt validated in hearing that. Pt, pt's husband and Dr. Shawnee Knapp talked through communication and how it is helpful in supporting pt's recovery.   Pt reported feeling better and supported in her relationship.   STRENGTHS (Protective Factors/Coping Skills): Family and insight  GOALS ADDRESSED: Patient will: 1. Reduce symptoms of: depression: pt has hx of suicidal thoughts, pt did not report any today; pt has denied SI for past visits; pt has had syx of feeling overwhelmed, sad and stressed; pt mood improved  2. Increase knowledge and/or ability of: self-management skills: to be able to recognize boundaries in giving to others and create self-compassion for self; lean on partner 3. Demonstrate ability to: Increase healthy adjustment to current life circumstances  INTERVENTIONS: Interventions utilized:Supportive Counselingand family systems  Standardized Assessments completed:Not Needed  ASSESSMENT: Patient currently experiencingdepressive episode due to current adjustments in life. Pt has hx of bipolar.   Patient may benefit fromcontinued supportive therapy and family support.  PLAN: 1. Follow up with behavioral health clinician on :therapy f/u 2. Behavioral recommendations:continued self-care and self-compassion 3. Referral(s):[facility] [employer] (In Clinic)   Royetta Asal, PhD., LMFT-A

## 2020-01-11 ENCOUNTER — Encounter: Payer: 59 | Admitting: Obstetrics & Gynecology

## 2020-01-12 ENCOUNTER — Other Ambulatory Visit: Payer: Self-pay

## 2020-01-12 ENCOUNTER — Ambulatory Visit: Payer: 59 | Admitting: Obstetrics and Gynecology

## 2020-01-12 VITALS — BP 111/70 | HR 87 | Wt 117.6 lb

## 2020-01-12 DIAGNOSIS — D563 Thalassemia minor: Secondary | ICD-10-CM

## 2020-01-12 DIAGNOSIS — Z8759 Personal history of other complications of pregnancy, childbirth and the puerperium: Secondary | ICD-10-CM

## 2020-01-12 DIAGNOSIS — Z3A16 16 weeks gestation of pregnancy: Secondary | ICD-10-CM

## 2020-01-12 DIAGNOSIS — F3181 Bipolar II disorder: Secondary | ICD-10-CM

## 2020-01-12 DIAGNOSIS — O0992 Supervision of high risk pregnancy, unspecified, second trimester: Secondary | ICD-10-CM

## 2020-01-12 NOTE — Progress Notes (Signed)
   PRENATAL VISIT NOTE  Subjective:  Dana Hart is a 34 y.o. L3T3428 at [redacted]w[redacted]d being seen today for ongoing prenatal care.  She is currently monitored for the following issues for this high-risk pregnancy and has Depression; Bipolar 2 disorder (HCC); Tobacco use disorder; Onychomycosis; Positive pregnancy test; Dizziness; Vaginal yeast infection; Supervision of high-risk pregnancy; Advanced maternal age in multigravida, first trimester; History of prior pregnancy with SGA newborn; Vaginitis; Suicide risk; Encounter for supervision of normal pregnancy, unspecified, unspecified trimester; and Thalassemia alpha carrier on their problem list.  Patient doing well with no acute concerns today. She reports occaisonal pain around umbilicus.  Contractions: Not present. Vag. Bleeding: None.   . Denies leaking of fluid.   The following portions of the patient's history were reviewed and updated as appropriate: allergies, current medications, past family history, past medical history, past social history, past surgical history and problem list. Problem list updated.  Objective:   Vitals:   01/12/20 1106  BP: 111/70  Pulse: 87  Weight: 117 lb 9.6 oz (53.3 kg)    Fetal Status: Fetal Heart Rate (bpm): 156         General:  Alert, oriented and cooperative. Patient is in no acute distress.  Skin: Skin is warm and dry. No rash noted.   Cardiovascular: Normal heart rate noted  Respiratory: Normal respiratory effort, no problems with respiration noted  Abdomen: Soft, gravid, appropriate for gestational age.  Pain/Pressure: Absent     Pelvic: Cervical exam deferred        Extremities: Normal range of motion.  Edema: None  Mental Status:  Normal mood and affect. Normal behavior. Normal judgment and thought content.   Assessment and Plan:  Pregnancy: J6O1157 at [redacted]w[redacted]d  1. Thalassemia alpha carrier   2. Bipolar 2 disorder (HCC)   3. Supervision of high risk pregnancy in second trimester Pt is  considering BTL at time or repeat c/s  4. History of prior pregnancy with SGA newborn Anatomy scan on 01/30/2020   Preterm labor symptoms and general obstetric precautions including but not limited to vaginal bleeding, contractions, leaking of fluid and fetal movement were reviewed in detail with the patient.  Please refer to After Visit Summary for other counseling recommendations.   Return in about 4 weeks (around 02/09/2020) for [facility], in person, AFP next visit.   Mariel Aloe, MD

## 2020-01-12 NOTE — Patient Instructions (Signed)

## 2020-01-20 ENCOUNTER — Other Ambulatory Visit: Payer: Self-pay

## 2020-01-20 ENCOUNTER — Ambulatory Visit (INDEPENDENT_AMBULATORY_CARE_PROVIDER_SITE_OTHER): Payer: 59 | Admitting: Psychology

## 2020-01-20 DIAGNOSIS — F3181 Bipolar II disorder: Secondary | ICD-10-CM

## 2020-01-20 NOTE — Progress Notes (Signed)
Integrated Behavioral Health Visit   01/20/2020 Dana Hart [id number]   Session Start time: 10  Session End time: 11 Total time: 60  Referring Provider: Dr. Rebbeca Paul   Confidentiality has been reviewed with patient.  Patient verbalized consent for a integrated care visit.   PRESENTING CONCERNS: Patient and/or family reports the following symptoms/concerns:   Pt shared a time in the past couple of weeks where she was able to challenge her old narrative of mistrust for others due to her past trauma.  Pt shared a heart felt apology from her mother about her past which was healing for her.  Pt shared about her relationship her father and past experiences that were hurtful to her.  Pt shared great insight into how her trauma impacts her current experiences.    Pt shared her past experiences with her parents and how it impacts her.  Pt and Dr. Shawnee Knapp processed emotion and trauma around this.    Pt did not report SI and reported improved mood and less stress.   Duration of problem:past two months ; Severity of problem: moderate  STRENGTHS (Protective Factors/Coping Skills): Family and insight  GOALS ADDRESSED: Patient will: 1. Reduce symptoms of: depression: pt has hx of suicidal thoughts, no current; pt has had syx of feeling overwhelmed, sad and stressed 2. Increase knowledge and/or ability of: self-management skills: to be able to recognize boundaries in giving to others and create self-compassion for self. 3. Demonstrate ability to: Increase healthy adjustment to current life circumstances  INTERVENTIONS: Interventions utilized:Supportive Counselingand family therapy Standardized Assessments completed:Not Needed  ASSESSMENT: Patient currently experiencingdepressive episode due to current adjustments in life. Pt has hx of bipolar.   Patient may benefit fromcontinued supportive therapy and family support.  PLAN: 1. Follow up with behavioral health  clinician on :therapy f/u 2. Behavioral recommendations:continued self-care and self-compassion 3. Referral(s):Integrated [employer] (In Clinic)   Royetta Asal, PhD., LMFT-A

## 2020-01-30 ENCOUNTER — Ambulatory Visit: Payer: 59 | Attending: Obstetrics and Gynecology

## 2020-01-30 ENCOUNTER — Encounter: Payer: Self-pay | Admitting: *Deleted

## 2020-01-30 ENCOUNTER — Other Ambulatory Visit: Payer: Self-pay | Admitting: *Deleted

## 2020-01-30 ENCOUNTER — Ambulatory Visit: Payer: 59 | Admitting: *Deleted

## 2020-01-30 ENCOUNTER — Other Ambulatory Visit: Payer: Self-pay

## 2020-01-30 ENCOUNTER — Other Ambulatory Visit: Payer: Self-pay | Admitting: Obstetrics and Gynecology

## 2020-01-30 VITALS — BP 115/57 | HR 93

## 2020-01-30 DIAGNOSIS — O09522 Supervision of elderly multigravida, second trimester: Secondary | ICD-10-CM

## 2020-01-30 DIAGNOSIS — Z3A19 19 weeks gestation of pregnancy: Secondary | ICD-10-CM

## 2020-01-30 DIAGNOSIS — O09529 Supervision of elderly multigravida, unspecified trimester: Secondary | ICD-10-CM | POA: Diagnosis present

## 2020-01-30 DIAGNOSIS — Z3490 Encounter for supervision of normal pregnancy, unspecified, unspecified trimester: Secondary | ICD-10-CM

## 2020-01-30 DIAGNOSIS — Z148 Genetic carrier of other disease: Secondary | ICD-10-CM | POA: Diagnosis not present

## 2020-01-30 DIAGNOSIS — O34219 Maternal care for unspecified type scar from previous cesarean delivery: Secondary | ICD-10-CM | POA: Diagnosis not present

## 2020-01-30 DIAGNOSIS — O09292 Supervision of pregnancy with other poor reproductive or obstetric history, second trimester: Secondary | ICD-10-CM

## 2020-01-30 DIAGNOSIS — O3503X0 Maternal care for (suspected) central nervous system malformation or damage in fetus, choroid plexus cysts, not applicable or unspecified: Secondary | ICD-10-CM

## 2020-01-30 DIAGNOSIS — O0990 Supervision of high risk pregnancy, unspecified, unspecified trimester: Secondary | ICD-10-CM

## 2020-01-30 DIAGNOSIS — O350XX0 Maternal care for (suspected) central nervous system malformation in fetus, not applicable or unspecified: Secondary | ICD-10-CM

## 2020-02-01 ENCOUNTER — Other Ambulatory Visit: Payer: Self-pay

## 2020-02-01 ENCOUNTER — Ambulatory Visit (INDEPENDENT_AMBULATORY_CARE_PROVIDER_SITE_OTHER): Payer: 59 | Admitting: Psychology

## 2020-02-01 DIAGNOSIS — F3181 Bipolar II disorder: Secondary | ICD-10-CM | POA: Diagnosis not present
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
## 2020-02-07 ENCOUNTER — Telehealth: Payer: Self-pay

## 2020-02-07 NOTE — Telephone Encounter (Signed)
Received VM from pt - pt did not indicate the nature of call LVM for pt to c/b

## 2020-02-09 ENCOUNTER — Other Ambulatory Visit: Payer: Self-pay

## 2020-02-09 ENCOUNTER — Encounter: Payer: Self-pay | Admitting: Obstetrics and Gynecology

## 2020-02-09 ENCOUNTER — Ambulatory Visit (INDEPENDENT_AMBULATORY_CARE_PROVIDER_SITE_OTHER): Payer: 59 | Admitting: Obstetrics and Gynecology

## 2020-02-09 VITALS — BP 119/76 | HR 97 | Wt 122.1 lb

## 2020-02-09 DIAGNOSIS — O0992 Supervision of high risk pregnancy, unspecified, second trimester: Secondary | ICD-10-CM

## 2020-02-09 DIAGNOSIS — O09521 Supervision of elderly multigravida, first trimester: Secondary | ICD-10-CM

## 2020-02-09 DIAGNOSIS — F3181 Bipolar II disorder: Secondary | ICD-10-CM

## 2020-02-09 DIAGNOSIS — O34219 Maternal care for unspecified type scar from previous cesarean delivery: Secondary | ICD-10-CM

## 2020-02-09 NOTE — Progress Notes (Signed)
   PRENATAL VISIT NOTE  Subjective:  Dana Hart is a 34 y.o. N4O2703 at [redacted]w[redacted]d being seen today for ongoing prenatal care.  She is currently monitored for the following issues for this high-risk pregnancy and has Depression; Bipolar 2 disorder (HCC); Tobacco use disorder; Onychomycosis; Positive pregnancy test; Dizziness; Vaginal yeast infection; Supervision of high-risk pregnancy; Advanced maternal age in multigravida, first trimester; History of prior pregnancy with SGA newborn; Vaginitis; Suicide risk; Encounter for supervision of normal pregnancy, unspecified, unspecified trimester; Thalassemia alpha carrier; and Previous cesarean delivery affecting pregnancy on their problem list.  Patient reports no complaints.  Contractions: Not present. Vag. Bleeding: None.   . Denies leaking of fluid.   The following portions of the patient's history were reviewed and updated as appropriate: allergies, current medications, past family history, past medical history, past social history, past surgical history and problem list.   Objective:   Vitals:   02/09/20 0918  BP: 119/76  Pulse: 97  Weight: 122 lb 1.6 oz (55.4 kg)    Fetal Status: Fetal Heart Rate (bpm): 152 Fundal Height: 20 cm       General:  Alert, oriented and cooperative. Patient is in no acute distress.  Skin: Skin is warm and dry. No rash noted.   Cardiovascular: Normal heart rate noted  Respiratory: Normal respiratory effort, no problems with respiration noted  Abdomen: Soft, gravid, appropriate for gestational age.  Pain/Pressure: Absent     Pelvic: Cervical exam deferred        Extremities: Normal range of motion.  Edema: None  Mental Status: Normal mood and affect. Normal behavior. Normal judgment and thought content.   Assessment and Plan:  Pregnancy: J0K9381 at [redacted]w[redacted]d 1. Supervision of high risk pregnancy in second trimester Patient is doing well without complaints Anatomy ultrasound reviewed with the patient. Follow up  ultrasound in September for bilateral choroid plexus cysts  2. Previous cesarean delivery affecting pregnancy Will be scheduled for repeat Patient is considering BTL  3. Advanced maternal age in multigravida, first trimester Low risks NIPS AFP today  4. Bipolar 2 disorder (HCC) Followed by psychiatrist Currently not on any medications  Preterm labor symptoms and general obstetric precautions including but not limited to vaginal bleeding, contractions, leaking of fluid and fetal movement were reviewed in detail with the patient. Please refer to After Visit Summary for other counseling recommendations.   Return in about 4 weeks (around 03/08/2020) for in person, ROB, High risk.  Future Appointments  Date Time Provider Department Center  02/10/2020 10:00 AM Alan Mulder, LMFT-A [facility]  02/28/2020  8:30 AM [facility] NURSE [facility]  02/28/2020  8:45 AM [facility] US4 [facility]  03/08/2020  8:15 AM Dashley Monts, Gigi Gin, MD [facility] None    Catalina Antigua, MD

## 2020-02-09 NOTE — Progress Notes (Signed)
Patient is unsure if she is feeling fetal movement, denies pain.

## 2020-02-10 ENCOUNTER — Ambulatory Visit (INDEPENDENT_AMBULATORY_CARE_PROVIDER_SITE_OTHER): Payer: Medicaid Other | Admitting: Psychology

## 2020-02-10 DIAGNOSIS — F3181 Bipolar II disorder: Secondary | ICD-10-CM | POA: Diagnosis not present

## 2020-02-10 NOTE — BH Specialist Note (Signed)
Integrated Behavioral Health Visit   02/10/2020 Dana Hart [id number]   Session Start time: 1015  Session End time: 11 Total time: 45   Referring Provider: Dr. Rebbeca Paul   Confidentiality has been reviewed with patient.  Patient verbalized consent for a integrated care visit.   PRESENTING CONCERNS: Patient and/or family reports the following symptoms/concerns: Pt reported that she coped with some issues regarding her mother and sister having a disagreement.  Pt shared how she was able to not engage in cope appropriately.    Pt shared another experience of feeling "let down" and how she was able to collect herself to engage appropriately.   Pt and Dr. Shawnee Knapp discussed her progress and how she is still experiencing triggers but handling triggers differently.   Duration of problem:past 3 months ; Severity of problem: moderate  STRENGTHS (Protective Factors/Coping Skills): Family and insight  GOALS ADDRESSED: Patient will: 1. Reduce symptoms of: depression: pt has hx of suicidal thoughts, no current; pt has had syx of feeling overwhelmed, sad and stressed 2. Increase knowledge and/or ability of: self-management skills: to be able to recognize boundaries in giving to others and create self-compassion for self. 3. Demonstrate ability to: Increase healthy adjustment to current life circumstances  INTERVENTIONS: Interventions utilized:Supportive Counselingand family therapy Standardized Assessments completed:Not Needed  ASSESSMENT: Patient currently experiencingdepressive episode due to current adjustments in life. Pt has hx of bipolar.   Patient may benefit fromcontinued supportive therapy and family support.  PLAN: 1. Follow up with behavioral health clinician on :2 week f/u 2. Behavioral recommendations:continued self-care and self-compassion 3. Referral(s):Integrated [employer] (In Clinic)   Royetta Asal, PhD., LMFT-A

## 2020-02-21 LAB — AFP, SERUM, OPEN SPINA BIFIDA
AFP MoM: 1.08
AFP Value: 85.8 ng/mL
Gest. Age on Collection Date: 20.6 weeks
Maternal Age At EDD: 35.1 yr
OSBR Risk 1 IN: 10000
Test Results:: NEGATIVE
Weight: 122 [lb_av]

## 2020-02-24 ENCOUNTER — Other Ambulatory Visit: Payer: Self-pay

## 2020-02-24 ENCOUNTER — Ambulatory Visit (INDEPENDENT_AMBULATORY_CARE_PROVIDER_SITE_OTHER): Payer: 59 | Admitting: Psychology

## 2020-02-24 DIAGNOSIS — F3181 Bipolar II disorder: Secondary | ICD-10-CM | POA: Diagnosis not present

## 2020-02-24 NOTE — BH Specialist Note (Signed)
Integrated Behavioral Health Visit   02/24/2020 Dana Hart [id number]   Session Start time: 10  Session End time: 1045 Total time: 45   Referring Provider: Dr. Rebbeca Paul   Confidentiality has been reviewed with patient.  Patient verbalized consent for a integrated care visit. Pt consented to allowing Dr. Wynelle Link shadow visit.  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 Pt shared she tried to go back to work and go overwhelmed. Pt shared she wanted to return to work for a "break" of other things in her life.  Pt and Dr. Shawnee Knapp talked about her process of stress and challenging those thoughts with not shutting down emotionally and reaching out to husband for support.    Duration of problem:past 3 months; Severity of problem: moderate  STRENGTHS (Protective Factors/Coping Skills): Family and insight  GOALS ADDRESSED: Patient will: 1. Reduce symptoms of: depression: pt has hx of suicidal thoughts, no current; pt has had syx of feeling overwhelmed, sad and stressed 2. Increase knowledge and/or ability of: self-management skills: to be able to recognize boundaries in giving to others and create self-compassion for self. 3. Demonstrate ability to: Increase healthy adjustment to current life circumstances  INTERVENTIONS: Interventions utilized:Supportive Counselingand family therapy Standardized Assessments completed:Not Needed  ASSESSMENT: Patient currently experiencingdepressive episode due to current adjustments in life. Pt has hx of bipolar.   Patient may benefit fromcontinued supportive therapy and family support.  PLAN: 1. Follow up with behavioral health clinician on :2 week f/u 2. Behavioral recommendations:continued self-care and self-compassion 3. Referral(s):Integrated [employer] (In Clinic)   Royetta Asal, PhD., LMFT-A

## 2020-02-28 ENCOUNTER — Encounter: Payer: Self-pay | Admitting: *Deleted

## 2020-02-28 ENCOUNTER — Other Ambulatory Visit: Payer: Self-pay | Admitting: *Deleted

## 2020-02-28 ENCOUNTER — Ambulatory Visit: Payer: 59 | Admitting: *Deleted

## 2020-02-28 ENCOUNTER — Ambulatory Visit: Payer: 59 | Attending: Obstetrics and Gynecology

## 2020-02-28 ENCOUNTER — Other Ambulatory Visit: Payer: Self-pay

## 2020-02-28 DIAGNOSIS — O09292 Supervision of pregnancy with other poor reproductive or obstetric history, second trimester: Secondary | ICD-10-CM | POA: Diagnosis not present

## 2020-02-28 DIAGNOSIS — O34219 Maternal care for unspecified type scar from previous cesarean delivery: Secondary | ICD-10-CM | POA: Diagnosis not present

## 2020-02-28 DIAGNOSIS — O09522 Supervision of elderly multigravida, second trimester: Secondary | ICD-10-CM

## 2020-02-28 DIAGNOSIS — O3503X0 Maternal care for (suspected) central nervous system malformation or damage in fetus, choroid plexus cysts, not applicable or unspecified: Secondary | ICD-10-CM

## 2020-02-28 DIAGNOSIS — Z362 Encounter for other antenatal screening follow-up: Secondary | ICD-10-CM | POA: Diagnosis not present

## 2020-02-28 DIAGNOSIS — O350XX0 Maternal care for (suspected) central nervous system malformation in fetus, not applicable or unspecified: Secondary | ICD-10-CM | POA: Diagnosis not present

## 2020-02-28 DIAGNOSIS — Z148 Genetic carrier of other disease: Secondary | ICD-10-CM

## 2020-02-28 DIAGNOSIS — O09529 Supervision of elderly multigravida, unspecified trimester: Secondary | ICD-10-CM

## 2020-02-28 DIAGNOSIS — Z3A23 23 weeks gestation of pregnancy: Secondary | ICD-10-CM

## 2020-03-08 ENCOUNTER — Ambulatory Visit (INDEPENDENT_AMBULATORY_CARE_PROVIDER_SITE_OTHER): Payer: 59 | Admitting: Obstetrics and Gynecology

## 2020-03-08 ENCOUNTER — Encounter: Payer: Self-pay | Admitting: Obstetrics and Gynecology

## 2020-03-08 ENCOUNTER — Other Ambulatory Visit: Payer: Self-pay

## 2020-03-08 VITALS — BP 108/70 | HR 88 | Wt 133.0 lb

## 2020-03-08 DIAGNOSIS — O0992 Supervision of high risk pregnancy, unspecified, second trimester: Secondary | ICD-10-CM

## 2020-03-08 DIAGNOSIS — O34219 Maternal care for unspecified type scar from previous cesarean delivery: Secondary | ICD-10-CM

## 2020-03-08 DIAGNOSIS — O09521 Supervision of elderly multigravida, first trimester: Secondary | ICD-10-CM

## 2020-03-08 MED ORDER — PREPLUS 27-1 MG PO TABS: 1.0000 | ORAL_TABLET | Freq: Every day | ORAL | 13 refills | Status: DC

## 2020-03-08 MED ORDER — PANTOPRAZOLE SODIUM 40 MG PO TBEC: 40.0000 mg | DELAYED_RELEASE_TABLET | Freq: Every day | ORAL | 3 refills | Status: DC

## 2020-03-08 NOTE — Progress Notes (Signed)
ROB [redacted]w[redacted]d  CC: Upper abdominal tightness and burning after eating or when laying down. Pt unable to get vitafol PNV gummies  states need Pre Auth.

## 2020-03-08 NOTE — Progress Notes (Signed)
   PRENATAL VISIT NOTE  Subjective:  Dana Hart is a 34 y.o. T2K4628 at [redacted]w[redacted]d being seen today for ongoing prenatal care.  She is currently monitored for the following issues for this high-risk pregnancy and has Depression; Bipolar 2 disorder (HCC); Tobacco use disorder; Onychomycosis; Positive pregnancy test; Dizziness; Vaginal yeast infection; Supervision of high-risk pregnancy; Advanced maternal age in multigravida, first trimester; History of prior pregnancy with SGA newborn; Vaginitis; Suicide risk; Encounter for supervision of normal pregnancy, unspecified, unspecified trimester; Thalassemia alpha carrier; and Previous cesarean delivery affecting pregnancy on their problem list.  Patient reports heartburn.  Contractions: Not present. Vag. Bleeding: None.  Movement: Present. Denies leaking of fluid.   The following portions of the patient's history were reviewed and updated as appropriate: allergies, current medications, past family history, past medical history, past social history, past surgical history and problem list.   Objective:   Vitals:   03/08/20 0819  BP: 108/70  Pulse: 88  Weight: 133 lb (60.3 kg)    Fetal Status: Fetal Heart Rate (bpm): 150 Fundal Height: 24 cm Movement: Present     General:  Alert, oriented and cooperative. Patient is in no acute distress.  Skin: Skin is warm and dry. No rash noted.   Cardiovascular: Normal heart rate noted  Respiratory: Normal respiratory effort, no problems with respiration noted  Abdomen: Soft, gravid, appropriate for gestational age.  Pain/Pressure: Absent     Pelvic: Cervical exam deferred        Extremities: Normal range of motion.  Edema: None  Mental Status: Normal mood and affect. Normal behavior. Normal judgment and thought content.   Assessment and Plan:  Pregnancy: M3O1771 at [redacted]w[redacted]d 1. Supervision of high risk pregnancy in second trimester Patient is doing well Rx protonix provided Third trimester labs with glucola  next visit  2. Previous cesarean delivery affecting pregnancy Will be scheduled for repeat  3. Advanced maternal age in multigravida, first trimester Low risks screening Follow up growth in November  Preterm labor symptoms and general obstetric precautions including but not limited to vaginal bleeding, contractions, leaking of fluid and fetal movement were reviewed in detail with the patient. Please refer to After Visit Summary for other counseling recommendations.   Return in about 4 weeks (around 04/05/2020) for in person, ROB, High risk, 2 hr glucola next visit.  Future Appointments  Date Time Provider Department Center  03/09/2020 10:00 AM Alan Mulder, LMFT-A [facility]  04/24/2020  8:30 AM [facility] NURSE [facility]  04/24/2020  8:45 AM [facility] US4 [facility]    Catalina Antigua, MD

## 2020-03-09 ENCOUNTER — Ambulatory Visit (INDEPENDENT_AMBULATORY_CARE_PROVIDER_SITE_OTHER): Payer: 59 | Admitting: Psychology

## 2020-03-09 ENCOUNTER — Other Ambulatory Visit: Payer: Self-pay

## 2020-03-09 DIAGNOSIS — F3181 Bipolar II disorder: Secondary | ICD-10-CM
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
## 2020-03-21 ENCOUNTER — Ambulatory Visit (INDEPENDENT_AMBULATORY_CARE_PROVIDER_SITE_OTHER): Payer: 59 | Admitting: Psychology

## 2020-03-21 ENCOUNTER — Other Ambulatory Visit: Payer: Self-pay

## 2020-03-21 DIAGNOSIS — F3181 Bipolar II disorder: Secondary | ICD-10-CM

## 2020-03-21 NOTE — BH Specialist Note (Signed)
Integrated Behavioral Health Visit   03/21/2020 Dana Hart [id number]   Session Start time: 10  Session End time: 1030 Total time: 30  Referring Provider: Dr. Rebbeca Paul   Confidentiality has been reviewed with patient.  Patient verbalized consent for a integrated care visit.   PRESENTING CONCERNS: Patient and/or family reports the following symptoms/concerns: Pt reported about her relationship with her mother and how there are times she feels she is still the "parent" in the relationship. Dr. Shawnee Knapp and pt discussed boundaries and what those might look like for pt's well being.    Duration of problem:past62months; Severity of problem: moderate  STRENGTHS (Protective Factors/Coping Skills): Family and insight  GOALS ADDRESSED: Patient will: 1. Reduce symptoms of: depression: pt has hx of suicidal thoughts, no current; pt has had syx of feeling overwhelmed, sad and stressed 2. Increase knowledge and/or ability of: self-management skills: to be able to recognize boundaries in giving to others and create self-compassion for self. 3. Demonstrate ability to: Increase healthy adjustment to current life circumstances  INTERVENTIONS: Interventions utilized:Supportive Counselingand family therapy Standardized Assessments completed:Not Needed  ASSESSMENT: Patient currently experiencingdepressive episode due to current adjustments in life. Pt has hx of bipolar.   Patient may benefit fromcontinued supportive therapy and family support.  PLAN: 1. Follow up with behavioral health clinician on :2 week f/u 2. Behavioral recommendations:continued self-care and self-compassion 3. Referral(s):Integrated [employer] (In Clinic)   Royetta Asal, PhD., LMFT-A

## 2020-03-23 ENCOUNTER — Ambulatory Visit: Payer: 59 | Admitting: Psychology

## 2020-04-05 ENCOUNTER — Other Ambulatory Visit: Payer: 59

## 2020-04-05 ENCOUNTER — Encounter: Payer: Self-pay | Admitting: Obstetrics and Gynecology

## 2020-04-05 ENCOUNTER — Ambulatory Visit (INDEPENDENT_AMBULATORY_CARE_PROVIDER_SITE_OTHER): Payer: 59 | Admitting: Obstetrics and Gynecology

## 2020-04-05 ENCOUNTER — Other Ambulatory Visit: Payer: Self-pay

## 2020-04-05 VITALS — BP 114/72 | HR 96 | Wt 139.0 lb

## 2020-04-05 DIAGNOSIS — O09521 Supervision of elderly multigravida, first trimester: Secondary | ICD-10-CM

## 2020-04-05 DIAGNOSIS — O0993 Supervision of high risk pregnancy, unspecified, third trimester: Secondary | ICD-10-CM

## 2020-04-05 DIAGNOSIS — O34219 Maternal care for unspecified type scar from previous cesarean delivery: Secondary | ICD-10-CM

## 2020-04-05 NOTE — Progress Notes (Signed)
ROB/GTT, c/o upper abdominal pain 7/10 x 2 weeks.  Pt declined FLU and TDAP Vaccines.

## 2020-04-05 NOTE — Progress Notes (Signed)
   PRENATAL VISIT NOTE  Subjective:  Dana Hart is a 34 y.o. [id number] at [redacted]w[redacted]d being seen today for ongoing prenatal care.  She is currently monitored for the following issues for this high-risk pregnancy and has Depression; Bipolar 2 disorder (HCC); Tobacco use disorder; Onychomycosis; Positive pregnancy test; Dizziness; Vaginal yeast infection; Supervision of high-risk pregnancy; Advanced maternal age in multigravida, first trimester; History of prior pregnancy with SGA newborn; Vaginitis; Suicide risk; Encounter for supervision of normal pregnancy, unspecified, unspecified trimester; Thalassemia alpha carrier; and Previous cesarean delivery affecting pregnancy on their problem list.  Patient reports no complaints.  Contractions: Not present. Vag. Bleeding: None.  Movement: Present. Denies leaking of fluid.   The following portions of the patient's history were reviewed and updated as appropriate: allergies, current medications, past family history, past medical history, past social history, past surgical history and problem list.   Objective:   Vitals:   04/05/20 0855  BP: 114/72  Pulse: 96  Weight: 139 lb (63 kg)    Fetal Status: Fetal Heart Rate (bpm): 147 Fundal Height: 28 cm Movement: Present     General:  Alert, oriented and cooperative. Patient is in no acute distress.  Skin: Skin is warm and dry. No rash noted.   Cardiovascular: Normal heart rate noted  Respiratory: Normal respiratory effort, no problems with respiration noted  Abdomen: Soft, gravid, appropriate for gestational age.  Pain/Pressure: Present     Pelvic: Cervical exam deferred        Extremities: Normal range of motion.  Edema: Trace  Mental Status: Normal mood and affect. Normal behavior. Normal judgment and thought content.   Assessment and Plan:  Pregnancy: [id number] at [redacted]w[redacted]d 1. Supervision of high risk pregnancy in third trimester Patient is doing well without complaints Third trimester labs with glucola  today  2. Previous cesarean delivery affecting pregnancy Patient will be scheduled for repeat with BTL  3. Advanced maternal age in multigravida, first trimester Follow up growth ultrasound 11/9  Preterm labor symptoms and general obstetric precautions including but not limited to vaginal bleeding, contractions, leaking of fluid and fetal movement were reviewed in detail with the patient. Please refer to After Visit Summary for other counseling recommendations.   Return in about 2 weeks (around 04/19/2020) for in person, ROB, High risk.  Future Appointments  Date Time Provider Department Center  04/06/2020 10:00 AM Alan Mulder, LMFT-A [facility]  04/24/2020  8:30 AM [facility] NURSE [facility]  04/24/2020  8:45 AM [facility] US4 [facility]    Catalina Antigua, MD

## 2020-04-06 ENCOUNTER — Ambulatory Visit: Payer: 59 | Admitting: Psychology

## 2020-04-06 LAB — CBC
Hematocrit: 32 % — ABNORMAL LOW (ref 34.0–46.6)
Hemoglobin: 10 g/dL — ABNORMAL LOW (ref 11.1–15.9)
MCH: 25.6 pg — ABNORMAL LOW (ref 26.6–33.0)
MCHC: 31.3 g/dL — ABNORMAL LOW (ref 31.5–35.7)
MCV: 82 fL (ref 79–97)
Platelets: 341 10*3/uL (ref 150–450)
RBC: 3.91 x10E6/uL (ref 3.77–5.28)
RDW: 15.2 % (ref 11.7–15.4)
WBC: 8.3 10*3/uL (ref 3.4–10.8)

## 2020-04-06 LAB — GLUCOSE TOLERANCE, 2 HOURS W/ 1HR
Glucose, 1 hour: 128 mg/dL (ref 65–179)
Glucose, 2 hour: 125 mg/dL (ref 65–152)
Glucose, Fasting: 80 mg/dL (ref 65–91)

## 2020-04-06 LAB — HIV ANTIBODY (ROUTINE TESTING W REFLEX): HIV Screen 4th Generation wRfx: NONREACTIVE

## 2020-04-06 LAB — RPR: RPR Ser Ql: NONREACTIVE

## 2020-04-09 ENCOUNTER — Ambulatory Visit (INDEPENDENT_AMBULATORY_CARE_PROVIDER_SITE_OTHER): Payer: 59 | Admitting: Psychology

## 2020-04-09 ENCOUNTER — Other Ambulatory Visit: Payer: Self-pay

## 2020-04-09 DIAGNOSIS — F3181 Bipolar II disorder: Secondary | ICD-10-CM

## 2020-04-09 NOTE — BH Specialist Note (Signed)
Integrated Behavioral Health Visit   04/09/2020 Dana Hart [id number]   Session Start time: 9  Session End time: 945 Total time: 45   Referring Provider: Dr. Rebbeca Paul   Confidentiality has been reviewed with patient.  Patient verbalized consent for a integrated care visit.   PRESENTING CONCERNS: Patient and/or family reports the following symptoms/concerns: Pt reported syx of anxiety coupled with feelings of being overwhelmed contributing to her depression. Pt shared fears around pregnancy and giving birth.  Pt reported hx of tough births with past two children.  Pt and Dr. Shawnee Knapp dicussed challenging anxious cognitions and reach out for support from husband.    Duration of problem:past57months; Severity of problem: moderate  STRENGTHS (Protective Factors/Coping Skills): Family and insight  GOALS ADDRESSED: Patient will: 1. Reduce symptoms of: depression: pt has hx of suicidal thoughts, no current; pt has had syx of feeling overwhelmed, sad and stressed 2. Increase knowledge and/or ability of: self-management skills: to be able to recognize boundaries in giving to others and create self-compassion for self. 3. Demonstrate ability to: Increase healthy adjustment to current life circumstances  INTERVENTIONS: Interventions utilized:Supportive Counselingand family therapy; CBT Standardized Assessments completed:Not Needed  ASSESSMENT: Patient currently experiencingdepressive episode due to current adjustments in life. Pt has hx of bipolar.   Patient may benefit fromcontinued supportive therapy, CBT and family support.  PLAN: 1. Follow up with behavioral health clinician on :2 week f/u 2. Behavioral recommendations:continued self-care and self-compassion 3. Referral(s):[facility] [employer] (In Clinic)   Royetta Asal, PhD., LMFT-A

## 2020-04-19 ENCOUNTER — Ambulatory Visit (INDEPENDENT_AMBULATORY_CARE_PROVIDER_SITE_OTHER): Payer: 59 | Admitting: Obstetrics and Gynecology

## 2020-04-19 ENCOUNTER — Other Ambulatory Visit: Payer: Self-pay

## 2020-04-19 VITALS — BP 106/67 | HR 97 | Wt 144.3 lb

## 2020-04-19 DIAGNOSIS — O34219 Maternal care for unspecified type scar from previous cesarean delivery: Secondary | ICD-10-CM

## 2020-04-19 DIAGNOSIS — D563 Thalassemia minor: Secondary | ICD-10-CM

## 2020-04-19 DIAGNOSIS — Z8759 Personal history of other complications of pregnancy, childbirth and the puerperium: Secondary | ICD-10-CM

## 2020-04-19 DIAGNOSIS — O0993 Supervision of high risk pregnancy, unspecified, third trimester: Secondary | ICD-10-CM

## 2020-04-19 NOTE — Patient Instructions (Signed)

## 2020-04-19 NOTE — Progress Notes (Signed)
   PRENATAL VISIT NOTE  Subjective:  Dana Hart is a 34 y.o. [id number] at [redacted]w[redacted]d being seen today for ongoing prenatal care.  She is currently monitored for the following issues for this high-risk pregnancy and has Depression; Bipolar 2 disorder (HCC); Tobacco use disorder; Onychomycosis; Positive pregnancy test; Dizziness; Vaginal yeast infection; Supervision of high-risk pregnancy; Advanced maternal age in multigravida, first trimester; History of prior pregnancy with SGA newborn; Vaginitis; Suicide risk; Encounter for supervision of normal pregnancy, unspecified, unspecified trimester; Thalassemia alpha carrier; and Previous cesarean delivery affecting pregnancy on their problem list.  Patient doing well with no acute concerns today. She reports no complaints.  Contractions: Not present. Vag. Bleeding: None.  Movement: Present. Denies leaking of fluid. Pt is doing well.  Reviewed  2 hour GTT and she is aware of growth scan next week.  Tubal papers previously signed.  The following portions of the patient's history were reviewed and updated as appropriate: allergies, current medications, past family history, past medical history, past social history, past surgical history and problem list. Problem list updated.  Objective:   Vitals:   04/19/20 0821  BP: 106/67  Pulse: 97  Weight: 144 lb 4.8 oz (65.5 kg)    Fetal Status: Fetal Heart Rate (bpm): 145   Movement: Present     General:  Alert, oriented and cooperative. Patient is in no acute distress.  Skin: Skin is warm and dry. No rash noted.   Cardiovascular: Normal heart rate noted  Respiratory: Normal respiratory effort, no problems with respiration noted  Abdomen: Soft, gravid, appropriate for gestational age.  Pain/Pressure: Absent     Pelvic: Cervical exam deferred        Extremities: Normal range of motion.  Edema: Trace  Mental Status:  Normal mood and affect. Normal behavior. Normal judgment and thought content.   Assessment and  Plan:  Pregnancy: [id number] at [redacted]w[redacted]d  1. Supervision of high risk pregnancy in third trimester Routine prenatal care  2. History of prior pregnancy with SGA newborn Growth scan on 11/9  3. Thalassemia alpha carrier   4. Previous cesarean delivery affecting pregnancy Repeat c/s with BTL scheduled previously  Preterm labor symptoms and general obstetric precautions including but not limited to vaginal bleeding, contractions, leaking of fluid and fetal movement were reviewed in detail with the patient.  Please refer to After Visit Summary for other counseling recommendations.   Return in about 2 weeks (around 05/03/2020) for [facility], in person.   Mariel Aloe, MD

## 2020-04-24 ENCOUNTER — Ambulatory Visit: Payer: 59 | Attending: Maternal & Fetal Medicine

## 2020-04-24 ENCOUNTER — Ambulatory Visit: Payer: 59 | Admitting: *Deleted

## 2020-04-24 ENCOUNTER — Encounter: Payer: Self-pay | Admitting: *Deleted

## 2020-04-24 ENCOUNTER — Other Ambulatory Visit: Payer: Self-pay

## 2020-04-24 DIAGNOSIS — O09529 Supervision of elderly multigravida, unspecified trimester: Secondary | ICD-10-CM | POA: Insufficient documentation

## 2020-04-24 DIAGNOSIS — O09523 Supervision of elderly multigravida, third trimester: Secondary | ICD-10-CM

## 2020-04-24 DIAGNOSIS — O34219 Maternal care for unspecified type scar from previous cesarean delivery: Secondary | ICD-10-CM | POA: Insufficient documentation

## 2020-04-24 DIAGNOSIS — Z148 Genetic carrier of other disease: Secondary | ICD-10-CM | POA: Diagnosis not present

## 2020-04-24 DIAGNOSIS — Z362 Encounter for other antenatal screening follow-up: Secondary | ICD-10-CM

## 2020-04-24 DIAGNOSIS — Z3A31 31 weeks gestation of pregnancy: Secondary | ICD-10-CM

## 2020-04-24 DIAGNOSIS — O09293 Supervision of pregnancy with other poor reproductive or obstetric history, third trimester: Secondary | ICD-10-CM

## 2020-04-27 ENCOUNTER — Other Ambulatory Visit: Payer: Self-pay

## 2020-04-27 ENCOUNTER — Ambulatory Visit (INDEPENDENT_AMBULATORY_CARE_PROVIDER_SITE_OTHER): Payer: 59 | Admitting: Psychology

## 2020-04-27 DIAGNOSIS — F3181 Bipolar II disorder: Secondary | ICD-10-CM

## 2020-04-27 NOTE — BH Specialist Note (Signed)
Integrated Behavioral Health Visit   04/27/2020 Dana Hart [id number]   Session Start time: 10  Session End time: 1045 Total time: 45   Referring Provider: Dr. Rebbeca Paul   Confidentiality has been reviewed with patient.  Patient verbalized consent for a integrated care visit.   PRESENTING CONCERNS: Patient and/or family reports the following symptoms/concerns: Pt reported her mood has been mostly stable.  Pt shared experiences of feeling angry with herself and times needing to process her emotions. Processed normalizing emotions vs. Bipolar syx.  Pt cited times where she was able to calm herself when she feels overwhelmed and angry especially when triggered old trauma.    Pt and Dr. Shawnee Knapp discussed her coping strategies for emotional stress and how insight helps her.  Pt endorsed at times thoughts of anger where she thinks about hurting others but denied plan and intent.    Duration of problem: past 4 months; Severity of problem: moderate    STRENGTHS (Protective Factors/Coping Skills): Family and insight  GOALS ADDRESSED: Patient will: 1. Reduce symptoms of: depression: pt has hx of suicidal thoughts, no current; pt has had syx of feeling overwhelmed, sad, angry and stressed 2. Increase knowledge and/or ability of: self-management skills: to be able to recognize boundaries in giving to others and create self-compassion for self. 3. Demonstrate ability to: Increase healthy adjustment to current life circumstances  INTERVENTIONS: Interventions utilized:Supportive Counselingand family therapy; CBT Standardized Assessments completed:Not Needed  ASSESSMENT: Patient currently experiencingdepressive episode due to current adjustments in life. Pt has hx of bipolar.   Patient may benefit fromcontinued supportive therapy, CBT and family support.  PLAN: 1. Follow up with behavioral health clinician on :2 week f/u 2. Behavioral recommendations:continued  self-care and self-compassion 3. Referral(s):[facility] [employer] (In Clinic)   Royetta Asal, PhD., LMFT-A

## 2020-05-03 ENCOUNTER — Other Ambulatory Visit: Payer: Self-pay

## 2020-05-03 ENCOUNTER — Ambulatory Visit (INDEPENDENT_AMBULATORY_CARE_PROVIDER_SITE_OTHER): Payer: 59 | Admitting: Obstetrics and Gynecology

## 2020-05-03 ENCOUNTER — Encounter: Payer: Self-pay | Admitting: Obstetrics and Gynecology

## 2020-05-03 VITALS — BP 110/71 | HR 101 | Wt 151.0 lb

## 2020-05-03 DIAGNOSIS — O09521 Supervision of elderly multigravida, first trimester: Secondary | ICD-10-CM

## 2020-05-03 DIAGNOSIS — D563 Thalassemia minor: Secondary | ICD-10-CM

## 2020-05-03 DIAGNOSIS — O34219 Maternal care for unspecified type scar from previous cesarean delivery: Secondary | ICD-10-CM

## 2020-05-03 DIAGNOSIS — Z3480 Encounter for supervision of other normal pregnancy, unspecified trimester: Secondary | ICD-10-CM

## 2020-05-03 NOTE — Progress Notes (Signed)
Subjective:  Dana Hart is a 34 y.o. [id number] at [redacted]w[redacted]d being seen today for ongoing prenatal care.  She is currently monitored for the following issues for this low-risk pregnancy and has Depression; Bipolar 2 disorder (HCC); Tobacco use disorder; Onychomycosis; Positive pregnancy test; Supervision of high-risk pregnancy; Advanced maternal age in multigravida, first trimester; History of prior pregnancy with SGA newborn; Suicide risk; Encounter for supervision of normal pregnancy, unspecified, unspecified trimester; Thalassemia alpha carrier; and Previous cesarean delivery affecting pregnancy on their problem list.  Patient reports general discomforts of pregnancy.  Contractions: Not present. Vag. Bleeding: None.  Movement: Present. Denies leaking of fluid.   The following portions of the patient's history were reviewed and updated as appropriate: allergies, current medications, past family history, past medical history, past social history, past surgical history and problem list. Problem list updated.  Objective:   Vitals:   05/03/20 1045  BP: 110/71  Pulse: (!) 101  Weight: 151 lb (68.5 kg)    Fetal Status: Fetal Heart Rate (bpm): 148   Movement: Present     General:  Alert, oriented and cooperative. Patient is in no acute distress.  Skin: Skin is warm and dry. No rash noted.   Cardiovascular: Normal heart rate noted  Respiratory: Normal respiratory effort, no problems with respiration noted  Abdomen: Soft, gravid, appropriate for gestational age. Pain/Pressure: Present     Pelvic:  Cervical exam deferred        Extremities: Normal range of motion.  Edema: Trace  Mental Status: Normal mood and affect. Normal behavior. Normal judgment and thought content.   Urinalysis:      Assessment and Plan:  Pregnancy: [id number] at [redacted]w[redacted]d  1. Supervision of other normal pregnancy, antepartum Stable  2. Advanced maternal age in multigravida, first trimester Stable  3. Previous cesarean  delivery affecting pregnancy For repeat with BTL  4. Thalassemia alpha carrier Stable  Preterm labor symptoms and general obstetric precautions including but not limited to vaginal bleeding, contractions, leaking of fluid and fetal movement were reviewed in detail with the patient. Please refer to After Visit Summary for other counseling recommendations.  Return in about 2 weeks (around 05/17/2020) for face to face, any provider.   Hermina Staggers, MD

## 2020-05-03 NOTE — Progress Notes (Signed)
HOB, c/o pressure

## 2020-05-03 NOTE — Patient Instructions (Signed)
Third Trimester of Pregnancy The third trimester is from week 28 through week 40 (months 7 through 9). The third trimester is a time when the unborn baby (fetus) is growing rapidly. At the end of the ninth month, the fetus is about 20 inches in length and weighs 6-10 pounds. Body changes during your third trimester Your body will continue to go through many changes during pregnancy. The changes vary from woman to woman. During the third trimester:  Your weight will continue to increase. You can expect to gain 25-35 pounds (11-16 kg) by the end of the pregnancy.  You may begin to get stretch marks on your hips, abdomen, and breasts.  You may urinate more often because the fetus is moving lower into your pelvis and pressing on your bladder.  You may develop or continue to have heartburn. This is caused by increased hormones that slow down muscles in the digestive tract.  You may develop or continue to have constipation because increased hormones slow digestion and cause the muscles that push waste through your intestines to relax.  You may develop hemorrhoids. These are swollen veins (varicose veins) in the rectum that can itch or be painful.  You may develop swollen, bulging veins (varicose veins) in your legs.  You may have increased body aches in the pelvis, back, or thighs. This is due to weight gain and increased hormones that are relaxing your joints.  You may have changes in your hair. These can include thickening of your hair, rapid growth, and changes in texture. Some women also have hair loss during or after pregnancy, or hair that feels dry or thin. Your hair will most likely return to normal after your baby is born.  Your breasts will continue to grow and they will continue to become tender. A yellow fluid (colostrum) may leak from your breasts. This is the first milk you are producing for your baby.  Your belly button may stick out.  You may notice more swelling in your hands,  face, or ankles.  You may have increased tingling or numbness in your hands, arms, and legs. The skin on your belly may also feel numb.  You may feel short of breath because of your expanding uterus.  You may have more problems sleeping. This can be caused by the size of your belly, increased need to urinate, and an increase in your body's metabolism.  You may notice the fetus "dropping," or moving lower in your abdomen (lightening).  You may have increased vaginal discharge.  You may notice your joints feel loose and you may have pain around your pelvic bone. What to expect at prenatal visits You will have prenatal exams every 2 weeks until week 36. Then you will have weekly prenatal exams. During a routine prenatal visit:  You will be weighed to make sure you and the baby are growing normally.  Your blood pressure will be taken.  Your abdomen will be measured to track your baby's growth.  The fetal heartbeat will be listened to.  Any test results from the previous visit will be discussed.  You may have a cervical check near your due date to see if your cervix has softened or thinned (effaced).  You will be tested for Group B streptococcus. This happens between 35 and 37 weeks. Your health care provider may ask you:  What your birth plan is.  How you are feeling.  If you are feeling the baby move.  If you have had any abnormal   symptoms, such as leaking fluid, bleeding, severe headaches, or abdominal cramping.  If you are using any tobacco products, including cigarettes, chewing tobacco, and electronic cigarettes.  If you have any questions. Other tests or screenings that may be performed during your third trimester include:  Blood tests that check for low iron levels (anemia).  Fetal testing to check the health, activity level, and growth of the fetus. Testing is done if you have certain medical conditions or if there are problems during the pregnancy.  Nonstress test  (NST). This test checks the health of your baby to make sure there are no signs of problems, such as the baby not getting enough oxygen. During this test, a belt is placed around your belly. The baby is made to move, and its heart rate is monitored during movement. What is false labor? False labor is a condition in which you feel small, irregular tightenings of the muscles in the womb (contractions) that usually go away with rest, changing position, or drinking water. These are called Braxton Hicks contractions. Contractions may last for hours, days, or even weeks before true labor sets in. If contractions come at regular intervals, become more frequent, increase in intensity, or become painful, you should see your health care provider. What are the signs of labor?  Abdominal cramps.  Regular contractions that start at 10 minutes apart and become stronger and more frequent with time.  Contractions that start on the top of the uterus and spread down to the lower abdomen and back.  Increased pelvic pressure and dull back pain.  A watery or bloody mucus discharge that comes from the vagina.  Leaking of amniotic fluid. This is also known as your "water breaking." It could be a slow trickle or a gush. Let your health care provider know if it has a color or strange odor. If you have any of these signs, call your health care provider right away, even if it is before your due date. Follow these instructions at home: Medicines  Follow your health care provider's instructions regarding medicine use. Specific medicines may be either safe or unsafe to take during pregnancy.  Take a prenatal vitamin that contains at least 600 micrograms (mcg) of folic acid.  If you develop constipation, try taking a stool softener if your health care provider approves. Eating and drinking   Eat a balanced diet that includes fresh fruits and vegetables, whole grains, good sources of protein such as meat, eggs, or tofu,  and low-fat dairy. Your health care provider will help you determine the amount of weight gain that is right for you.  Avoid raw meat and uncooked cheese. These carry germs that can cause birth defects in the baby.  If you have low calcium intake from food, talk to your health care provider about whether you should take a daily calcium supplement.  Eat four or five small meals rather than three large meals a day.  Limit foods that are high in fat and processed sugars, such as fried and sweet foods.  To prevent constipation: ? Drink enough fluid to keep your urine clear or pale yellow. ? Eat foods that are high in fiber, such as fresh fruits and vegetables, whole grains, and beans. Activity  Exercise only as directed by your health care provider. Most women can continue their usual exercise routine during pregnancy. Try to exercise for 30 minutes at least 5 days a week. Stop exercising if you experience uterine contractions.  Avoid heavy lifting.  Do   not exercise in extreme heat or humidity, or at high altitudes.  Wear low-heel, comfortable shoes.  Practice good posture.  You may continue to have sex unless your health care provider tells you otherwise. Relieving pain and discomfort  Take frequent breaks and rest with your legs elevated if you have leg cramps or low back pain.  Take warm sitz baths to soothe any pain or discomfort caused by hemorrhoids. Use hemorrhoid cream if your health care provider approves.  Wear a good support bra to prevent discomfort from breast tenderness.  If you develop varicose veins: ? Wear support pantyhose or compression stockings as told by your healthcare provider. ? Elevate your feet for 15 minutes, 3-4 times a day. Prenatal care  Write down your questions. Take them to your prenatal visits.  Keep all your prenatal visits as told by your health care provider. This is important. Safety  Wear your seat belt at all times when driving.  Make  a list of emergency phone numbers, including numbers for family, friends, the hospital, and police and fire departments. General instructions  Avoid cat litter boxes and soil used by cats. These carry germs that can cause birth defects in the baby. If you have a cat, ask someone to clean the litter box for you.  Do not travel far distances unless it is absolutely necessary and only with the approval of your health care provider.  Do not use hot tubs, steam rooms, or saunas.  Do not drink alcohol.  Do not use any products that contain nicotine or tobacco, such as cigarettes and e-cigarettes. If you need help quitting, ask your health care provider.  Do not use any medicinal herbs or unprescribed drugs. These chemicals affect the formation and growth of the baby.  Do not douche or use tampons or scented sanitary pads.  Do not cross your legs for long periods of time.  To prepare for the arrival of your baby: ? Take prenatal classes to understand, practice, and ask questions about labor and delivery. ? Make a trial run to the hospital. ? Visit the hospital and tour the maternity area. ? Arrange for maternity or paternity leave through employers. ? Arrange for family and friends to take care of pets while you are in the hospital. ? Purchase a rear-facing car seat and make sure you know how to install it in your car. ? Pack your hospital bag. ? Prepare the baby's nursery. Make sure to remove all pillows and stuffed animals from the baby's crib to prevent suffocation.  Visit your dentist if you have not gone during your pregnancy. Use a soft toothbrush to brush your teeth and be gentle when you floss. Contact a health care provider if:  You are unsure if you are in labor or if your water has broken.  You become dizzy.  You have mild pelvic cramps, pelvic pressure, or nagging pain in your abdominal area.  You have lower back pain.  You have persistent nausea, vomiting, or  diarrhea.  You have an unusual or bad smelling vaginal discharge.  You have pain when you urinate. Get help right away if:  Your water breaks before 37 weeks.  You have regular contractions less than 5 minutes apart before 37 weeks.  You have a fever.  You are leaking fluid from your vagina.  You have spotting or bleeding from your vagina.  You have severe abdominal pain or cramping.  You have rapid weight loss or weight gain.  You have   shortness of breath with chest pain.  You notice sudden or extreme swelling of your face, hands, ankles, feet, or legs.  Your baby makes fewer than 10 movements in 2 hours.  You have severe headaches that do not go away when you take medicine.  You have vision changes. Summary  The third trimester is from week 28 through week 40, months 7 through 9. The third trimester is a time when the unborn baby (fetus) is growing rapidly.  During the third trimester, your discomfort may increase as you and your baby continue to gain weight. You may have abdominal, leg, and back pain, sleeping problems, and an increased need to urinate.  During the third trimester your breasts will keep growing and they will continue to become tender. A yellow fluid (colostrum) may leak from your breasts. This is the first milk you are producing for your baby.  False labor is a condition in which you feel small, irregular tightenings of the muscles in the womb (contractions) that eventually go away. These are called Braxton Hicks contractions. Contractions may last for hours, days, or even weeks before true labor sets in.  Signs of labor can include: abdominal cramps; regular contractions that start at 10 minutes apart and become stronger and more frequent with time; watery or bloody mucus discharge that comes from the vagina; increased pelvic pressure and dull back pain; and leaking of amniotic fluid. This information is not intended to replace advice given to you by your  health care provider. Make sure you discuss any questions you have with your health care provider. Document Revised: 09/23/2018 Document Reviewed: 07/08/2016 [employer] Patient Education  2020 [employer]  

## 2020-05-08 ENCOUNTER — Other Ambulatory Visit: Payer: Self-pay

## 2020-05-08 ENCOUNTER — Ambulatory Visit (INDEPENDENT_AMBULATORY_CARE_PROVIDER_SITE_OTHER): Payer: 59 | Admitting: Psychology

## 2020-05-08 DIAGNOSIS — F3181 Bipolar II disorder: Secondary | ICD-10-CM

## 2020-05-08 NOTE — BH Specialist Note (Signed)
Integrated Behavioral Health Visit   05/08/2020 Dana Hart [id number]   Session Start time: 10  Session End time: 1030 Total time: 30  Referring Provider: Dr. Rebbeca Paul   Confidentiality has been reviewed with patient.  Patient verbalized consent for a integrated care visit.   PRESENTING CONCERNS: Patient and/or family reports the following symptoms/concerns: Pt reported an experience of anxiety when receiving confirmation for her C section coming up.  Pt reported she was anxious for a couple of hours and worried it would be similar to her previous C sections.  Pt endorsed passive SI during this time but was able to fight these thoughts.   Pt shared an experience she had with her mom where she stepped into the "mother role" Discussed how this impacted previous trauma and creates new experiences with her mother.     Duration of problem: past 4 months; Severity of problem: moderate    STRENGTHS (Protective Factors/Coping Skills): Family and insight  GOALS ADDRESSED: Patient will: 1. Reduce symptoms of: depression: pt has hx of suicidal thoughts, endorsed passive SI recently ; pt has had syx of feeling overwhelmed, sad, angry and stressed 2. Increase knowledge and/or ability of: self-management skills: to be able to recognize boundaries in giving to others and create self-compassion for self. 3. Demonstrate ability to: Increase healthy adjustment to current life circumstances  INTERVENTIONS: Interventions utilized:Supportive Counselingand family therapy; CBT Standardized Assessments completed:Not Needed  ASSESSMENT: Patient currently experiencingdepressive episode due to current adjustments in life. Pt has hx of bipolar.   Patient may benefit fromcontinued supportive therapy, CBTand family support.  PLAN: 1. Follow up with behavioral health clinician on :3 week f/u 2. Behavioral recommendations:continued self-care and  self-compassion 3. Referral(s):[facility] [facility] (In Clinic)   Royetta Asal, PhD., LMFT-A

## 2020-05-17 ENCOUNTER — Other Ambulatory Visit: Payer: Self-pay

## 2020-05-17 ENCOUNTER — Ambulatory Visit (INDEPENDENT_AMBULATORY_CARE_PROVIDER_SITE_OTHER): Payer: 59 | Admitting: Obstetrics and Gynecology

## 2020-05-17 VITALS — BP 120/78 | HR 94 | Wt 157.0 lb

## 2020-05-17 DIAGNOSIS — F3181 Bipolar II disorder: Secondary | ICD-10-CM

## 2020-05-17 DIAGNOSIS — O0993 Supervision of high risk pregnancy, unspecified, third trimester: Secondary | ICD-10-CM

## 2020-05-17 DIAGNOSIS — Z8759 Personal history of other complications of pregnancy, childbirth and the puerperium: Secondary | ICD-10-CM

## 2020-05-17 DIAGNOSIS — D563 Thalassemia minor: Secondary | ICD-10-CM

## 2020-05-17 DIAGNOSIS — F17200 Nicotine dependence, unspecified, uncomplicated: Secondary | ICD-10-CM

## 2020-05-17 DIAGNOSIS — O34219 Maternal care for unspecified type scar from previous cesarean delivery: Secondary | ICD-10-CM

## 2020-05-17 DIAGNOSIS — Z98891 History of uterine scar from previous surgery: Secondary | ICD-10-CM | POA: Insufficient documentation

## 2020-05-17 NOTE — Patient Instructions (Addendum)
Third Trimester of Pregnancy  The third trimester is from week 28 through week 40 (months 7 through 9). This trimester is when your unborn baby (fetus) is growing very fast. At the end of the ninth month, the unborn baby is about 20 inches in length. It weighs about 6-10 pounds. Follow these instructions at home: Medicines  Take over-the-counter and prescription medicines only as told by your doctor. Some medicines are safe and some medicines are not safe during pregnancy.  Take a prenatal vitamin that contains at least 600 micrograms (mcg) of folic acid.  If you have trouble pooping (constipation), take medicine that will make your stool soft (stool softener) if your doctor approves. Eating and drinking   Eat regular, healthy meals.  Avoid raw meat and uncooked cheese.  If you get low calcium from the food you eat, talk to your doctor about taking a daily calcium supplement.  Eat four or five small meals rather than three large meals a day.  Avoid foods that are high in fat and sugars, such as fried and sweet foods.  To prevent constipation: ? Eat foods that are high in fiber, like fresh fruits and vegetables, whole grains, and beans. ? Drink enough fluids to keep your pee (urine) clear or pale yellow. Activity  Exercise only as told by your doctor. Stop exercising if you start to have cramps.  Avoid heavy lifting, wear low heels, and sit up straight.  Do not exercise if it is too hot, too humid, or if you are in a place of great height (high altitude).  You may continue to have sex unless your doctor tells you not to. Relieving pain and discomfort  Wear a good support bra if your breasts are tender.  Take frequent breaks and rest with your legs raised if you have leg cramps or low back pain.  Take warm water baths (sitz baths) to soothe pain or discomfort caused by hemorrhoids. Use hemorrhoid cream if your doctor approves.  If you develop puffy, bulging veins (varicose  veins) in your legs: ? Wear support hose or compression stockings as told by your doctor. ? Raise (elevate) your feet for 15 minutes, 3-4 times a day. ? Limit salt in your food. Safety  Wear your seat belt when driving.  Make a list of emergency phone numbers, including numbers for family, friends, the hospital, and police and fire departments. Preparing for your baby's arrival To prepare for the arrival of your baby:  Take prenatal classes.  Practice driving to the hospital.  Visit the hospital and tour the maternity area.  Talk to your work about taking leave once the baby comes.  Pack your hospital bag.  Prepare the baby's room.  Go to your doctor visits.  Buy a rear-facing car seat. Learn how to install it in your car. General instructions  Do not use hot tubs, steam rooms, or saunas.  Do not use any products that contain nicotine or tobacco, such as cigarettes and e-cigarettes. If you need help quitting, ask your doctor.  Do not drink alcohol.  Do not douche or use tampons or scented sanitary pads.  Do not cross your legs for long periods of time.  Do not travel for long distances unless you must. Only do so if your doctor says it is okay.  Visit your dentist if you have not gone during your pregnancy. Use a soft toothbrush to brush your teeth. Be gentle when you floss.  Avoid cat litter boxes and soil   used by cats. These carry germs that can cause birth defects in the baby and can cause a loss of your baby (miscarriage) or stillbirth.  Keep all your prenatal visits as told by your doctor. This is important. Contact a doctor if:  You are not sure if you are in labor or if your water has broken.  You are dizzy.  You have mild cramps or pressure in your lower belly.  You have a nagging pain in your belly area.  You continue to feel sick to your stomach, you throw up, or you have watery poop.  You have bad smelling fluid coming from your vagina.  You have  pain when you pee. Get help right away if:  You have a fever.  You are leaking fluid from your vagina.  You are spotting or bleeding from your vagina.  You have severe belly cramps or pain.  You lose or gain weight quickly.  You have trouble catching your breath and have chest pain.  You notice sudden or extreme puffiness (swelling) of your face, hands, ankles, feet, or legs.  You have not felt the baby move in over an hour.  You have severe headaches that do not go away with medicine.  You have trouble seeing.  You are leaking, or you are having a gush of fluid, from your vagina before you are 37 weeks.  You have regular belly spasms (contractions) before you are 37 weeks. Summary  The third trimester is from week 28 through week 40 (months 7 through 9). This time is when your unborn baby is growing very fast.  Follow your doctor's advice about medicine, food, and activity.  Get ready for the arrival of your baby by taking prenatal classes, getting all the baby items ready, preparing the baby's room, and visiting your doctor to be checked.  Get help right away if you are bleeding from your vagina, or you have chest pain and trouble catching your breath, or if you have not felt your baby move in over an hour. This information is not intended to replace advice given to you by your health care provider. Make sure you discuss any questions you have with your health care provider. Document Revised: 09/23/2018 Document Reviewed: 07/08/2016 Elsevier Patient Education  2020 [employer]  Intrauterine Device Information An intrauterine device (IUD) is a medical device that is inserted in the uterus to prevent pregnancy. It is a small, T-shaped device that has one or two nylon strings hanging down from it. The strings hang out of the lower part of the uterus (cervix) to allow for future IUD removal. There are two types of IUDs available:  Hormone IUD. This type of IUD is made of  plastic and contains the hormone progestin (synthetic progesterone). A hormone IUD may last 3-5 years.  Copper IUD. This type of IUD has copper wire wrapped around it. A copper IUD may last up to 10 years. How is an IUD inserted? An IUD is inserted through the vagina and placed into the uterus with a minor medical procedure. The exact procedure for IUD insertion may vary among health care providers and hospitals. How does an IUD work? Synthetic progesterone in a hormonal IUD prevents pregnancy by:  Thickening cervical mucus to prevent sperm from entering the uterus.  Thinning the uterine lining to prevent a fertilized egg from being implanted there. Copper in a copper IUD prevents pregnancy by making the uterus and fallopian tubes produce a fluid that kills sperm. What  are the advantages of an IUD? Advantages of either type of IUD  It is highly effective in preventing pregnancy.  It is reversible. You can become pregnant shortly after the IUD is removed.  It is low-maintenance and can stay in place for a long time.  There are no estrogen-related side effects.  It can be used when breastfeeding.  It is not associated with weight gain.  It can be inserted right after childbirth, an abortion, or a miscarriage. Advantages of a hormone IUD  If it is inserted within 7 days of your period starting, it works right after it is inserted. If the hormone IUD is inserted at any other time in your cycle, you will need to use a backup method of birth control for 7 days after insertion.  It can make menstrual periods lighter.  It can reduce menstrual cramping.  It can be used for 3-5 years. Advantages of a copper IUD  It works right after it is inserted.  It can be used as a form of emergency birth control if it is inserted within 5 days after having unprotected sex.  It does not interfere with your body's natural hormones.  It can be used for 10 years. What are the disadvantages of an  IUD?  An IUD may cause irregular menstrual bleeding for a period of time after insertion.  You may have pain during insertion and have cramping and vaginal bleeding after insertion.  An IUD may cut the uterus (uterine perforation) when it is inserted. This is rare.  An IUD may cause pelvic inflammatory disease (PID), which is an infection in the uterus and fallopian tubes. This is rare, and it usually happens during the first 20 days after the IUD is inserted.  A copper IUD can make your menstrual flow heavier and more painful. How is an IUD removed?  You will lie on your back with your knees bent and your feet in footrests (stirrups).  A device will be inserted into your vagina to spread apart the vaginal walls (speculum). This will allow your health care provider to see the strings attached to the IUD.  Your health care provider will use a small instrument (forceps) to grasp the IUD strings and pull firmly until the IUD is removed. You may have some discomfort when the IUD is removed. Your health care provider may recommend taking over-the-counter pain relievers, such as ibuprofen, before the procedure. You may also have minor spotting for a few days after the procedure. The exact procedure for IUD removal may vary among health care providers and hospitals. Is the IUD right for me? Your health care provider will make sure you are a good candidate for an IUD and will discuss the advantages, disadvantages, and possible side effects with you. Summary  An intrauterine device (IUD) is a medical device that is inserted in the uterus to prevent pregnancy. It is a small, T-shaped device that has one or two nylon strings hanging down from it.  A hormone IUD contains the hormone progestin (synthetic progesterone). A copper IUD has copper wire wrapped around it.  Synthetic progesterone in a hormone IUD prevents pregnancy by thickening cervical mucus and thinning the walls of the uterus. Copper in  a copper IUD prevents pregnancy by making the uterus and fallopian tubes produce a fluid that kills sperm.  A hormone IUD can be left in place for 3-5 years. A copper IUD can be left in place for up to 10 years.  An IUD  is inserted and removed by a health care provider. You may feel some pain during insertion and removal. Your health care provider may recommend taking over-the-counter pain medicine, such as ibuprofen, before an IUD procedure. This information is not intended to replace advice given to you by your health care provider. Make sure you discuss any questions you have with your health care provider. Document Revised: 05/15/2017 Document Reviewed: 07/01/2016 Elsevier Patient Education  2020 Elsevier Inc.  Postpartum Tubal Ligation Postpartum tubal ligation (PPTL) is a procedure to close the fallopian tubes. This is done so that you cannot get pregnant. When the fallopian tubes are closed, the eggs that the ovaries release cannot enter the uterus, and sperm cannot reach the eggs. PPTL is done right after childbirth or 1-2 days after childbirth, before the uterus returns to its normal location. If you have a cesarean section, it can be performed at the same time as the procedure. Having this done after childbirth does not make your stay in the hospital longer. PPTL is sometimes called "getting your tubes tied." You should not have this procedure if you want to get pregnant again or if you are unsure about having more children. Tell a health care provider about:  Any allergies you have.  All medicines you are taking, including vitamins, herbs, eye drops, creams, and over-the-counter medicines.  Any problems you or family members have had with anesthetic medicines.  Any blood disorders you have.  Any surgeries you have had.  Any medical conditions you have or have had.  Any past pregnancies. What are the risks? Generally, this is a safe procedure. However, problems may occur,  including:  Infection.  Bleeding.  Injury to other organs in the abdomen.  Side effects from anesthetic medicines.  Failure of the procedure. If this happens, you could get pregnant.  Having a fertilized egg attach outside the uterus (ectopic pregnancy). What happens before the procedure?  Ask your health care provider about: ? How much pain you can expect to have. ? What medicines you will be given for pain, especially if you are planning to breastfeed. What happens during the procedure? If you had a vaginal delivery:  You will be given one or more of the following: ? A medicine to help you relax (sedative). ? A medicine to numb the area (local anesthetic). ? A medicine to make you fall asleep (general anesthetic). ? A medicine that is injected into an area of your body to numb everything below the injection site (regional anesthetic).  If you have been given a general anesthetic, a tube will be put down your throat to help you breathe.  An IV will be inserted into one of your veins.  Your bladder may be emptied with a small tube (catheter).  An incision will be made just below your belly button.  Your fallopian tubes will be located and brought up through the incision.  Your fallopian tubes will be tied off, burned (cauterized), or blocked with a clip, ring, or clamp. A small part in the center of each fallopian tube may be removed.  The incision will be closed with stitches (sutures).  A bandage (dressing) will be placed over the incision. If you had a cesarean delivery:  Tubal ligation will be done through the incision that was used for the cesarean delivery of your baby.  The incision will be closed with sutures.  A dressing will be placed over the incision. The procedure may vary among health care providers and hospitals.  What happens after the procedure?  Your blood pressure, heart rate, breathing rate, and blood oxygen level will be monitored until you  leave the hospital.  You will be given pain medicine as needed.  Do not drive for 24 hours if you were given a sedative during your procedure. Summary  Postpartum tubal ligation is a procedure that closes the fallopian tubes so you cannot get pregnant anymore.  This procedure is done while you are still in the hospital after childbirth. If you have a cesarean section, it can be performed at the same time.  Having this done after childbirth does not make your stay in the hospital longer.  Postpartum tubal ligation is considered permanent. You should not have this procedure if you want to get pregnant again or if you are unsure about having more children.  Talk to your health care provider to see if this procedure is right for you. This information is not intended to replace advice given to you by your health care provider. Make sure you discuss any questions you have with your health care provider. Document Revised: 11/15/2018 Document Reviewed: 04/22/2018 Elsevier Patient Education  2020 [employer].

## 2020-05-17 NOTE — Progress Notes (Signed)
   PRENATAL VISIT NOTE  Subjective:  Dana Hart is a 34 y.o. [id number] at [redacted]w[redacted]d being seen today for ongoing prenatal care.  She is currently monitored for the following issues for this high-risk pregnancy and has Depression; Bipolar 2 disorder (HCC); Tobacco use disorder; Onychomycosis; Positive pregnancy test; Supervision of high-risk pregnancy; Advanced maternal age in multigravida, first trimester; History of prior pregnancy with SGA newborn; Suicide risk; Encounter for supervision of normal pregnancy, unspecified, unspecified trimester; Thalassemia alpha carrier; Previous cesarean delivery affecting pregnancy; and History of 2 cesarean sections on their problem list.  Patient doing well with no acute concerns today. She reports mild hand swelling.  Contractions: Not present. Vag. Bleeding: None.  Movement: Present. Denies leaking of fluid.   Long discussion with the patient regarding her future contraception.  She is 95% sure about tubal ligation at time of c section.  Pt advised she needs to be 100 % on board, otherwise, risk of regret is too high.  She is concerned about hormonal contraception affecting her mood disorders.  Pt advised she could also consider postpartum paraguard IUD.  She will sign medicaid BTL papers today just in case.  Pt will have decision hopefully at her 36 week visit  The following portions of the patient's history were reviewed and updated as appropriate: allergies, current medications, past family history, past medical history, past social history, past surgical history and problem list. Problem list updated.  Objective:   Vitals:   05/17/20 0822  BP: 120/78  Pulse: 94  Weight: 157 lb (71.2 kg)    Fetal Status: Fetal Heart Rate (bpm): 140   Movement: Present     General:  Alert, oriented and cooperative. Patient is in no acute distress.  Skin: Skin is warm and dry. No rash noted.   Cardiovascular: Normal heart rate noted  Respiratory: Normal respiratory  effort, no problems with respiration noted  Abdomen: Soft, gravid, appropriate for gestational age.  Pain/Pressure: Present     Pelvic: Cervical exam deferred        Extremities: Normal range of motion.  Edema: Trace  Mental Status:  Normal mood and affect. Normal behavior. Normal judgment and thought content.   Assessment and Plan:  Pregnancy: [id number] at [redacted]w[redacted]d  1. Bipolar 2 disorder (HCC) Pt currently stable and doing well  2. Tobacco use disorder   3. Supervision of high risk pregnancy in third trimester BTL form signed, pt will have final decision regarding  Tubal versus paraguard IUD by her 36 week visit  4. History of prior pregnancy with SGA newborn Fundal height currently appropriate  5. Thalassemia alpha carrier   6. Previous cesarean delivery affecting pregnancy Repeat c section scheduled  7. History of 2 cesarean sections   Preterm labor symptoms and general obstetric precautions including but not limited to vaginal bleeding, contractions, leaking of fluid and fetal movement were reviewed in detail with the patient.  Please refer to After Visit Summary for other counseling recommendations.   Return in about 2 weeks (around 05/31/2020) for [facility], in person, 36 weeks swabs.   Mariel Aloe, MD

## 2020-05-17 NOTE — Progress Notes (Signed)
ROB   [id number] in hands.  BTL consent signed today.

## 2020-05-25 ENCOUNTER — Other Ambulatory Visit: Payer: Self-pay

## 2020-05-25 ENCOUNTER — Ambulatory Visit (INDEPENDENT_AMBULATORY_CARE_PROVIDER_SITE_OTHER): Payer: PRIVATE HEALTH INSURANCE | Admitting: Psychology

## 2020-05-25 DIAGNOSIS — F3181 Bipolar II disorder: Secondary | ICD-10-CM | POA: Diagnosis not present

## 2020-05-25 NOTE — BH Specialist Note (Signed)
Integrated Behavioral Health Follow Up In-Person Visit  MRN: [id number] Name: Dana Hart  Number of Integrated Behavioral Health Clinician visits: 15 Session Start time: 10  Session End time: 1045 Total time: 45 minutes  Types of Service: Individual psychotherapy  Subjective: Dana Hart is a 34 y.o. female  Patient was referred by Dr. Rebbeca Paul for depressed mood Patient reports the following symptoms/concerns: Pt reports she had some depressed and anxious thoughts a couple of weeks ago. Pt shared she is anxious about becoming older and having a new baby.  Pt shared she did have passive SI during this time. Pt denied plan and intent.   Discussed leaning on supports; processing emotion; and not engaging in trauma brain thoughts.  Duration of problem: past 32months; Severity of problem: moderate  Objective: Mood: Euthymic and Affect: Appropriate Risk of harm to self or others: Suicidal ideation : passive; denied plan and intent. Pt has crisis numbers.   Life Context: Family and Social:supportive family; hx of childhood trauma  Life Changes: pregnancy  Patient and/or Family's Strengths/Protective Factors: Social and Emotional competence  Goals Addressed: Patient will: 1. Reduce symptoms of: depression: pt has hx of suicidal thoughts, endorsed passive SI recently ; pt has had syx of feeling overwhelmed, sad, angryand stressed, worry thoughts  2. Increase knowledge and/or ability of: self-management skills: to be able to recognize boundaries in giving to others and create self-compassion for self. 3. Demonstrate ability to: Increase healthy adjustment to current life circumstances   Progress towards Goals: Ongoing  Interventions: Interventions utilized:  Supportive Counseling Standardized Assessments completed: Not Needed  Patient and/or Family Response: Pt engaged during appt   Patient Centered Plan: Patient is on the following Treatment Plan(s): Depression  management plan  Assessment: Patient currently experiencingdepressive episode due to current adjustments in life. Pt has hx of bipolar.   Patient may benefit fromcontinued supportive therapy, CBTand family support.  PLAN: 1. Follow up with behavioral health clinician on :1 week f/u 2. Behavioral recommendations:continued self-care and self-compassion 3. Referral(s):[facility] [employer] (In Clinic)  Royetta Asal, PhD., LMFT-A

## 2020-05-30 ENCOUNTER — Ambulatory Visit (INDEPENDENT_AMBULATORY_CARE_PROVIDER_SITE_OTHER): Payer: PRIVATE HEALTH INSURANCE | Admitting: Psychology

## 2020-05-30 DIAGNOSIS — F3181 Bipolar II disorder: Secondary | ICD-10-CM

## 2020-05-30 NOTE — BH Specialist Note (Signed)
Integrated Behavioral Health Follow Up In-Person Visit  MRN: [id number] Name: Dana Hart  Number of Integrated Behavioral Health Clinician visits: 16 Session Start time: 10  Session End time: 1045 Total time: 45  minutes  Types of Service: Individual psychotherapy   Subjective: Dana Hart is a 34 y.o. female  Patient was referred by Dr. Rebbeca Paul for depressed mood.  Pt consented to Dr. Neita Garnet sitting in today's appointment to shadow.   Patient reports the following symptoms/concerns: Pt reported having higher anxiety and moments of depressed mood since last appointment. Pt shared she feels anxious and has negative thoughts that she should be "contributing more" by going back to work. Pt reported at times she feels "hopeless" and does have thoughts of not wanting to be here. Pt denied plan and intent.   Discussed leaning on supports; processing emotion; and not engaging in trauma brain thoughts. Duration of problem: past 5 months; Severity of problem: moderate  Objective: Mood: Depressed and Affect: Depressed Risk of harm to self or others: Suicidal ideation :denied plan and intent  Life Context: Family and Social:supportive family; hx of childhood trauma  Life Changes: pregnancy  Patient and/or Family's Strengths/Protective Factors: Social and Emotional competence  Goals Addressed: Patient will: 1. Reduce symptoms of: depression: pt has hx of suicidal thoughts,endorsed passive SI recently; pt has had syx of feeling overwhelmed, sad, angryand stressed, worry thoughts  2. Increase knowledge and/or ability of: self-management skills: to be able to recognize boundaries in giving to others and create self-compassion for self. 3. Demonstrate ability to: Increase healthy adjustment to current life circumstances   Progress towards Goals: Ongoing  Interventions: Interventions utilized:  Supportive Counseling Standardized Assessments completed: Not  Needed  Patient and/or Family Response: Pt engaged during appt   Patient Centered Plan: Patient is on the following Treatment Plan(s): Depression management plan  Assessment: Patient currently experiencingdepressive episode due to current adjustments in life. Pt has hx of bipolar.   Patient may benefit fromcontinued supportive therapy, CBTand family support.  PLAN: 1. Follow up with behavioral health clinician on :1week f/u 2. Behavioral recommendations:continued self-care and self-compassion 3. Referral(s):[facility] [employer] (In Clinic)  Royetta Asal, PhD., LMFT-A

## 2020-06-01 ENCOUNTER — Other Ambulatory Visit (HOSPITAL_COMMUNITY)
Admission: RE | Admit: 2020-06-01 | Discharge: 2020-06-01 | Disposition: A | Discharge status: Home / Self Care | Payer: 59 | Source: Ambulatory Visit | Attending: Obstetrics & Gynecology | Admitting: Obstetrics & Gynecology

## 2020-06-01 ENCOUNTER — Other Ambulatory Visit: Payer: Self-pay

## 2020-06-01 ENCOUNTER — Ambulatory Visit (INDEPENDENT_AMBULATORY_CARE_PROVIDER_SITE_OTHER): Payer: 59 | Admitting: Obstetrics & Gynecology

## 2020-06-01 VITALS — BP 131/79 | HR 92 | Wt 161.0 lb

## 2020-06-01 DIAGNOSIS — O0993 Supervision of high risk pregnancy, unspecified, third trimester: Secondary | ICD-10-CM | POA: Insufficient documentation

## 2020-06-01 DIAGNOSIS — B373 Candidiasis of vulva and vagina: Secondary | ICD-10-CM | POA: Diagnosis not present

## 2020-06-01 DIAGNOSIS — O23593 Infection of other part of genital tract in pregnancy, third trimester: Secondary | ICD-10-CM | POA: Insufficient documentation

## 2020-06-01 DIAGNOSIS — B9689 Other specified bacterial agents as the cause of diseases classified elsewhere: Secondary | ICD-10-CM | POA: Insufficient documentation

## 2020-06-01 DIAGNOSIS — Z3A36 36 weeks gestation of pregnancy: Secondary | ICD-10-CM | POA: Diagnosis not present

## 2020-06-01 DIAGNOSIS — Z98891 History of uterine scar from previous surgery: Secondary | ICD-10-CM

## 2020-06-01 DIAGNOSIS — Z3480 Encounter for supervision of other normal pregnancy, unspecified trimester: Secondary | ICD-10-CM

## 2020-06-01 DIAGNOSIS — O34219 Maternal care for unspecified type scar from previous cesarean delivery: Secondary | ICD-10-CM

## 2020-06-01 MED ORDER — WRIST BRACE MISC: 1.0000 | Freq: Every day | 0 refills | Status: DC

## 2020-06-01 NOTE — Patient Instructions (Signed)
Cesarean Delivery, Care After This sheet gives you information about how to care for yourself after your procedure. Your health care provider may also give you more specific instructions. If you have problems or questions, contact your health care provider. What can I expect after the procedure? After the procedure, it is common to have:  A small amount of blood or clear fluid coming from the incision.  Some redness, swelling, and pain in your incision area.  Some abdominal pain and soreness.  Vaginal bleeding (lochia). Even though you did not have a vaginal delivery, you will still have vaginal bleeding and discharge.  Pelvic cramps.  Fatigue. You may have pain, swelling, and discomfort in the tissue between your vagina and your anus (perineum) if:  Your C-section was unplanned, and you were allowed to labor and push.  An incision was made in the area (episiotomy) or the tissue tore during attempted vaginal delivery. Follow these instructions at home: Incision care   Follow instructions from your health care provider about how to take care of your incision. Make sure you: ? Wash your hands with soap and water before you change your bandage (dressing). If soap and water are not available, use hand sanitizer. ? If you have a dressing, change it or remove it as told by your health care provider. ? Leave stitches (sutures), skin staples, skin glue, or adhesive strips in place. These skin closures may need to stay in place for 2 weeks or longer. If adhesive strip edges start to loosen and curl up, you may trim the loose edges. Do not remove adhesive strips completely unless your health care provider tells you to do that.  Check your incision area every day for signs of infection. Check for: ? More redness, swelling, or pain. ? More fluid or blood. ? Warmth. ? Pus or a bad smell.  Do not take baths, swim, or use a hot tub until your health care provider says it's okay. Ask your health  care provider if you can take showers.  When you cough or sneeze, hug a pillow. This helps with pain and decreases the chance of your incision opening up (dehiscing). Do this until your incision heals. Medicines  Take over-the-counter and prescription medicines only as told by your health care provider.  If you were prescribed an antibiotic medicine, take it as told by your health care provider. Do not stop taking the antibiotic even if you start to feel better.  Do not drive or use heavy machinery while taking prescription pain medicine. Lifestyle  Do not drink alcohol. This is especially important if you are breastfeeding or taking pain medicine.  Do not use any products that contain nicotine or tobacco, such as cigarettes, e-cigarettes, and chewing tobacco. If you need help quitting, ask your health care provider. Eating and drinking  Drink at least 8 eight-ounce glasses of water every day unless told not to by your health care provider. If you breastfeed, you may need to drink even more water.  Eat high-fiber foods every day. These foods may help prevent or relieve constipation. High-fiber foods include: ? Whole grain cereals and breads. ? Brown rice. ? Beans. ? Fresh fruits and vegetables. Activity   If possible, have someone help you care for your baby and help with household activities for at least a few days after you leave the hospital.  Return to your normal activities as told by your health care provider. Ask your health care provider what activities are safe for   you.  Rest as much as possible. Try to rest or take a nap while your baby is sleeping.  Do not lift anything that is heavier than 10 lbs (4.5 kg), or the limit that you were told, until your health care provider says that it is safe.  Talk with your health care provider about when you can engage in sexual activity. This may depend on your: ? Risk of infection. ? How fast you heal. ? Comfort and desire to  engage in sexual activity. General instructions  Do not use tampons or douches until your health care provider approves.  Wear loose, comfortable clothing and a supportive and well-fitting bra.  Keep your perineum clean and dry. Wipe from front to back when you use the toilet.  If you pass a blood clot, save it and call your health care provider to discuss. Do not flush blood clots down the toilet before you get instructions from your health care provider.  Keep all follow-up visits for you and your baby as told by your health care provider. This is important. Contact a health care provider if:  You have: ? A fever. ? Bad-smelling vaginal discharge. ? Pus or a bad smell coming from your incision. ? Difficulty or pain when urinating. ? A sudden increase or decrease in the frequency of your bowel movements. ? More redness, swelling, or pain around your incision. ? More fluid or blood coming from your incision. ? A rash. ? Nausea. ? Little or no interest in activities you used to enjoy. ? Questions about caring for yourself or your baby.  Your incision feels warm to the touch.  Your breasts turn red or become painful or hard.  You feel unusually sad or worried.  You vomit.  You pass a blood clot from your vagina.  You urinate more than usual.  You are dizzy or light-headed. Get help right away if:  You have: ? Pain that does not go away or get better with medicine. ? Chest pain. ? Difficulty breathing. ? Blurred vision or spots in your vision. ? Thoughts about hurting yourself or your baby. ? New pain in your abdomen or in one of your legs. ? A severe headache.  You faint.  You bleed from your vagina so much that you fill more than one sanitary pad in one hour. Bleeding should not be heavier than your heaviest period. Summary  After the procedure, it is common to have pain at your incision site, abdominal cramping, and slight bleeding from your vagina.  Check  your incision area every day for signs of infection.  Tell your health care provider about any unusual symptoms.  Keep all follow-up visits for you and your baby as told by your health care provider. This information is not intended to replace advice given to you by your health care provider. Make sure you discuss any questions you have with your health care provider. Document Revised: 12/09/2017 Document Reviewed: 12/09/2017 Elsevier Patient Education  2020 [employer]  

## 2020-06-01 NOTE — Progress Notes (Signed)
   PRENATAL VISIT NOTE  Subjective:  Dana Hart is a 34 y.o. [id number] at [redacted]w[redacted]d being seen today for ongoing prenatal care.  She is currently monitored for the following issues for this high-risk pregnancy and has Depression; Bipolar 2 disorder (HCC); Tobacco use disorder; Onychomycosis; Positive pregnancy test; Supervision of high-risk pregnancy; Advanced maternal age in multigravida, first trimester; History of prior pregnancy with SGA newborn; Suicide risk; Encounter for supervision of normal pregnancy, unspecified, unspecified trimester; Thalassemia alpha carrier; Previous cesarean delivery affecting pregnancy; and History of 2 cesarean sections on their problem list.  Patient reports bilateral CTS sx.  Contractions: Not present. Vag. Bleeding: None.  Movement: Present. Denies leaking of fluid.   The following portions of the patient's history were reviewed and updated as appropriate: allergies, current medications, past family history, past medical history, past social history, past surgical history and problem list.   Objective:   Vitals:   06/01/20 0831  BP: 131/79  Pulse: 92  Weight: 161 lb (73 kg)    Fetal Status: Fetal Heart Rate (bpm): 150   Movement: Present     General:  Alert, oriented and cooperative. Patient is in no acute distress.  Skin: Skin is warm and dry. No rash noted.   Cardiovascular: Normal heart rate noted  Respiratory: Normal respiratory effort, no problems with respiration noted  Abdomen: Soft, gravid, appropriate for gestational age.  Pain/Pressure: Present     Pelvic: Cervical exam deferred        Extremities: Normal range of motion.     Mental Status: Normal mood and affect. Normal behavior. Normal judgment and thought content.   Assessment and Plan:  Pregnancy: [id number] at [redacted]w[redacted]d 1. Previous cesarean delivery affecting pregnancy 39 week repeat  2. History of 2 cesarean sections RCS  3. Supervision of high risk pregnancy in third  trimester Routine labs - Strep Gp B NAA - Cervicovaginal ancillary only( [place]) - Wrist brace cock up volar  4. Supervision of other normal pregnancy, antepartum Orders Placed This Encounter  Procedures  . Wrist brace cock up volar  . Strep Gp B NAA     Preterm labor symptoms and general obstetric precautions including but not limited to vaginal bleeding, contractions, leaking of fluid and fetal movement were reviewed in detail with the patient. Please refer to After Visit Summary for other counseling recommendations.   Return in about 1 week (around 06/08/2020).  Future Appointments  Date Time Provider Department Center  06/06/2020 10:00 AM Alan Mulder, LMFT-A [facility]  06/07/2020  3:40 PM Burleson, Brand Males, NP [facility] None    Scheryl Darter, MD

## 2020-06-03 LAB — STREP GP B NAA: Strep Gp B NAA: NEGATIVE

## 2020-06-04 LAB — CERVICOVAGINAL ANCILLARY ONLY
Bacterial Vaginitis (gardnerella): POSITIVE — AB
Candida Glabrata: NEGATIVE
Candida Vaginitis: POSITIVE — AB
Chlamydia: NEGATIVE
Comment: NEGATIVE
Comment: NEGATIVE
Comment: NEGATIVE
Comment: NEGATIVE
Comment: NEGATIVE
Comment: NORMAL
Neisseria Gonorrhea: NEGATIVE
Trichomonas: NEGATIVE

## 2020-06-06 ENCOUNTER — Ambulatory Visit (INDEPENDENT_AMBULATORY_CARE_PROVIDER_SITE_OTHER): Payer: PRIVATE HEALTH INSURANCE | Admitting: Psychology

## 2020-06-06 ENCOUNTER — Encounter (HOSPITAL_COMMUNITY): Payer: Self-pay

## 2020-06-06 ENCOUNTER — Other Ambulatory Visit: Payer: Self-pay

## 2020-06-06 DIAGNOSIS — F3181 Bipolar II disorder: Secondary | ICD-10-CM | POA: Diagnosis not present

## 2020-06-06 NOTE — BH Specialist Note (Signed)
Integrated Behavioral Health Follow Up In-Person Visit  MRN: [id number] Name: Dana Hart  Number of Integrated Behavioral Health Clinician visits: 17 Session Start time: 10  Session End time: 1045 Total time: 45  minutes  Types of Service: Individual psychotherapy   Subjective: Dana Hart is a 34 y.o. female  Patient was referred by Dr. Rebbeca Paul  for depressed mood .   Patient reports the following symptoms/concerns: Pt shared she was able to be vulnerable with her husband and share her stressors with him.  She reported this was very helpful for her.    Discussed how having this child has led her to get the help she needed for mental health and being a better mom.   Will discuss upcoming anxieties of C Section next visit    Duration of problem: past 5 months; Severity of problem: moderate  Objective: Mood: Euthymic and Affect: Appropriate Risk of harm to self or others: No plan to harm self or others  Life Context: Family and Social:supportive family; hx of childhood trauma Life Changes:pregnancy  Patient and/or Family's Strengths/Protective Factors: Social and Emotional competence  Goals Addressed: Patient will: 1. Reduce symptoms of: depression: pt has hx of suicidal thoughts,endorsed passive SI recently; none reported today; pt has had syx of feeling overwhelmed, sad, angryand stressed, worry thoughts 2. Increase knowledge and/or ability of: self-management skills: to be able to recognize boundaries in giving to others and create self-compassion for self. 3. Demonstrate ability to: Increase healthy adjustment to current life circumstances   Progress towards Goals: Ongoing  Interventions: Interventions utilized:Supportive Counseling and accessing family support  Standardized Assessments completed:Not Needed  Patient and/or Family Response:Pt engaged during appt  Patient Centered Plan: Patient is on the following Treatment  Plan(s):Depression management plan Assessment: Patient currently experiencingdepressive episode due to current adjustments in life. Pt has hx of bipolar.   Patient may benefit fromcontinued supportive therapy, CBTand family support.  PLAN: 1. Follow up with behavioral health clinician on :1week f/u 2. Behavioral recommendations:continued self-care and self-compassion 3. Referral(s):[facility] [employer] (In Clinic)  Royetta Asal, PhD., LMFT-A

## 2020-06-06 NOTE — Patient Instructions (Signed)
Dana Hart  06/06/2020   Your procedure is scheduled on:  06/17/2020  Arrive at 0645 at Entrance C on [address] at [facility]  and [employer]. You are invited to use the FREE valet parking or use the Visitor's parking deck.  Pick up the phone at the desk and dial [phone].  Call this number if you have problems the morning of surgery: [phone]  Remember:   Do not eat food:(After Midnight) Desps de medianoche.  Do not drink clear liquids: (After Midnight) Desps de medianoche.  Take these medicines the morning of surgery with A SIP OF WATER:  none   Do not wear jewelry, make-up or nail polish.  Do not wear lotions, powders, or perfumes. Do not wear deodorant.  Do not shave 48 hours prior to surgery.  Do not bring valuables to the hospital.  [facility] is not   responsible for any belongings or valuables brought to the hospital.  Contacts, dentures or bridgework may not be worn into surgery.  Leave suitcase in the car. After surgery it may be brought to your room.  For patients admitted to the hospital, checkout time is 11:00 AM the day of              discharge.      Please read over the following fact sheets that you were given:     Preparing for Surgery

## 2020-06-07 ENCOUNTER — Ambulatory Visit (INDEPENDENT_AMBULATORY_CARE_PROVIDER_SITE_OTHER): Payer: 59 | Admitting: Nurse Practitioner

## 2020-06-07 VITALS — BP 137/81 | HR 91 | Wt 160.2 lb

## 2020-06-07 DIAGNOSIS — B379 Candidiasis, unspecified: Secondary | ICD-10-CM

## 2020-06-07 DIAGNOSIS — O34219 Maternal care for unspecified type scar from previous cesarean delivery: Secondary | ICD-10-CM

## 2020-06-07 DIAGNOSIS — N760 Acute vaginitis: Secondary | ICD-10-CM

## 2020-06-07 DIAGNOSIS — B9689 Other specified bacterial agents as the cause of diseases classified elsewhere: Secondary | ICD-10-CM

## 2020-06-07 DIAGNOSIS — Z3A37 37 weeks gestation of pregnancy: Secondary | ICD-10-CM

## 2020-06-07 DIAGNOSIS — Z3480 Encounter for supervision of other normal pregnancy, unspecified trimester: Secondary | ICD-10-CM

## 2020-06-07 MED ORDER — METRONIDAZOLE 500 MG PO TABS: 500.0000 mg | ORAL_TABLET | Freq: Two times a day (BID) | ORAL | 0 refills | Status: DC

## 2020-06-07 MED ORDER — TERCONAZOLE 0.4 % VA CREA: 1.0000 | TOPICAL_CREAM | Freq: Every day | VAGINAL | 0 refills | Status: DC

## 2020-06-07 NOTE — Patient Instructions (Signed)

## 2020-06-07 NOTE — Progress Notes (Signed)
Patient presents for ROB. Patient has concerns about getting her wedding ring off before c-section. She also complains of intermittent headaches, but not enough to take medication for. Resolves on its own. No other concerns.

## 2020-06-07 NOTE — Progress Notes (Signed)
    Subjective:  Dana Hart is a 34 y.o. [id number] at [redacted]w[redacted]d being seen today for ongoing prenatal care.  She is currently monitored for the following issues for this high-risk pregnancy and has Depression; Bipolar 2 disorder (HCC); Tobacco use disorder; Onychomycosis; Positive pregnancy test; Supervision of high-risk pregnancy; Advanced maternal age in multigravida, first trimester; History of prior pregnancy with SGA newborn; Suicide risk; Encounter for supervision of normal pregnancy, unspecified, unspecified trimester; Thalassemia alpha carrier; Previous cesarean delivery affecting pregnancy; and History of 2 cesarean sections on their problem list.  Patient reports swelling in hands and cannot get wedding ring off.  Contractions: Irritability. Vag. Bleeding: None.  Movement: Present. Denies leaking of fluid.   The following portions of the patient's history were reviewed and updated as appropriate: allergies, current medications, past family history, past medical history, past social history, past surgical history and problem list. Problem list updated.  Objective:   Vitals:   06/07/20 1549  BP: 137/81  Pulse: 91  Weight: 160 lb 3.2 oz (72.7 kg)    Fetal Status: Fetal Heart Rate (bpm): 153 Fundal Height: 42 cm Movement: Present     General:  Alert, oriented and cooperative. Patient is in no acute distress.  Skin: Skin is warm and dry. No rash noted.   Cardiovascular: Normal heart rate noted  Respiratory: Normal respiratory effort, no problems with respiration noted  Abdomen: Soft, gravid, appropriate for gestational age. Pain/Pressure: Present     Pelvic:  Cervical exam deferred        Extremities: Normal range of motion.  Edema: Trace  Mental Status: Normal mood and affect. Normal behavior. Normal judgment and thought content.   Urinalysis:      Assessment and Plan:  Pregnancy: [id number] at [redacted]w[redacted]d  1. Supervision of other normal pregnancy, antepartum Has had some headaches that  resolved without intervention Reviewed warning signs for increasing BP including headaches, blurred vision, RUQ and worsening edema.  Advised to take BP at home if having symptoms.  Reviewed BP that is too high 140/90 (either value).  2. Previous cesarean delivery affecting pregnancy Has scheduled C/S Cannot remove wedding rings - reviewed how to try to remove at home and advised having them cut by jeweler if unable to remove Attempted removal in office today but it was too painful for client to fully remove them. Watch fundal height.  Consider [place] if continues to be elevated.  3. Yeast infection  - terconazole (TERAZOL 7) 0.4 % vaginal cream; Place 1 applicator vaginally at bedtime.  Dispense: 45 g; Refill: 0  4. BV (bacterial vaginosis)  - metroNIDAZOLE (FLAGYL) 500 MG tablet; Take 1 tablet (500 mg total) by mouth 2 (two) times daily.  Dispense: 14 tablet; Refill: 0   Term labor symptoms and general obstetric precautions including but not limited to vaginal bleeding, contractions, leaking of fluid and fetal movement were reviewed in detail with the patient. Please refer to After Visit Summary for other counseling recommendations.  Return in about 5 days (around 06/12/2020) for in person ROB on Tuesday or Wed.Nolene Bernheim, RN, MSN, NP-BC Nurse Practitioner, [facility] for [facility], [facility] 06/07/2020 4:29 PM

## 2020-06-12 ENCOUNTER — Other Ambulatory Visit: Payer: Self-pay

## 2020-06-12 ENCOUNTER — Ambulatory Visit (INDEPENDENT_AMBULATORY_CARE_PROVIDER_SITE_OTHER): Payer: 59 | Admitting: Advanced Practice Midwife

## 2020-06-12 VITALS — BP 130/78 | HR 100 | Wt 166.0 lb

## 2020-06-12 DIAGNOSIS — Z3A38 38 weeks gestation of pregnancy: Secondary | ICD-10-CM

## 2020-06-12 DIAGNOSIS — Z3480 Encounter for supervision of other normal pregnancy, unspecified trimester: Secondary | ICD-10-CM

## 2020-06-12 DIAGNOSIS — O34219 Maternal care for unspecified type scar from previous cesarean delivery: Secondary | ICD-10-CM

## 2020-06-12 DIAGNOSIS — O479 False labor, unspecified: Secondary | ICD-10-CM

## 2020-06-12 NOTE — Progress Notes (Signed)
ROB, c/o pressure and contractions

## 2020-06-12 NOTE — Patient Instructions (Signed)
Labor Precautions Reasons to come to [facility] at [facility]:  1.  Contractions are  5 minutes apart or less, each last 1 minute, these have been going on for 1-2 hours, and you cannot walk or talk during them 2.  You have a large gush of fluid, or a trickle of fluid that will not stop and you have to wear a pad 3.  You have bleeding that is bright red, heavier than spotting--like menstrual bleeding (spotting can be normal in early labor or after a check of your cervix) 4.  You do not feel the baby moving like he/she normally does  

## 2020-06-12 NOTE — Progress Notes (Signed)
   PRENATAL VISIT NOTE  Subjective:  Dana Hart is a 34 y.o. [id number] at [redacted]w[redacted]d being seen today for ongoing prenatal care.  She is currently monitored for the following issues for this low-risk pregnancy and has Depression; Bipolar 2 disorder (HCC); Tobacco use disorder; Onychomycosis; Positive pregnancy test; Supervision of high-risk pregnancy; Advanced maternal age in multigravida, first trimester; History of prior pregnancy with SGA newborn; Suicide risk; Encounter for supervision of normal pregnancy, unspecified, unspecified trimester; Thalassemia alpha carrier; Previous cesarean delivery affecting pregnancy; and History of 2 cesarean sections on their problem list.  Patient reports occasional contractions.  Contractions: Irregular. Vag. Bleeding: None.  Movement: Present. Denies leaking of fluid.   The following portions of the patient's history were reviewed and updated as appropriate: allergies, current medications, past family history, past medical history, past social history, past surgical history and problem list.   Objective:   Vitals:   06/12/20 0952  BP: 130/78  Pulse: 100  Weight: 166 lb (75.3 kg)    Fetal Status: Fetal Heart Rate (bpm): 153 Fundal Height: 38 cm Movement: Present  Presentation: Vertex  General:  Alert, oriented and cooperative. Patient is in no acute distress.  Skin: Skin is warm and dry. No rash noted.   Cardiovascular: Normal heart rate noted  Respiratory: Normal respiratory effort, no problems with respiration noted  Abdomen: Soft, gravid, appropriate for gestational age.  Pain/Pressure: Present     Pelvic: Cervical exam performed in the presence of a chaperone Dilation: Fingertip Effacement (%): 0 Station: -3  Extremities: Normal range of motion.  Edema: Trace  Mental Status: Normal mood and affect. Normal behavior. Normal judgment and thought content.   Assessment and Plan:  Pregnancy: [id number] at [redacted]w[redacted]d 1. Supervision of other normal pregnancy,  antepartum --Anticipatory guidance about next visits/weeks of pregnancy given. --Pt has repeat C/s scheduled on Sunday, 06/17/20  2. Previous cesarean delivery affecting pregnancy --Repeat C/S scheduled on Sunday, 06/17/20.  3. Braxton Hicks contractions --Contractions with tightening and pressure ~ 5 times per hour, then resolve and return. Reviewed labor precautions/reasons to seek care. --Cervix 0.5 cm/long today, no s/sx of labor.  Good fetal movement.  4. [redacted] weeks gestation of pregnancy  Term labor symptoms and general obstetric precautions including but not limited to vaginal bleeding, contractions, leaking of fluid and fetal movement were reviewed in detail with the patient. Please refer to After Visit Summary for other counseling recommendations.   No follow-ups on file.  Future Appointments  Date Time Provider Department Center  06/13/2020 10:00 AM Alan Mulder, LMFT-A [facility]  06/14/2020  9:30 AM [facility] PAT 1 [facility] None  06/14/2020 10:30 AM [facility]ENING [facility] None    Sharen Counter, CNM

## 2020-06-13 ENCOUNTER — Ambulatory Visit (INDEPENDENT_AMBULATORY_CARE_PROVIDER_SITE_OTHER): Payer: 59 | Admitting: Psychology

## 2020-06-13 DIAGNOSIS — F3181 Bipolar II disorder: Secondary | ICD-10-CM | POA: Diagnosis not present

## 2020-06-13 NOTE — BH Specialist Note (Signed)
Integrated Behavioral Health Follow Up In-Person Visit  MRN: [id number] Name: Dana Hart  Number of Integrated Behavioral Health Clinician visits: 18 Session Start time: 10  Session End time: 1045 Total time: 45  minutes  Types of Service: Individual psychotherapy    Subjective: Dana Hart is a 35 y.o. female  Patient was referred by Dr. Rebbeca Paul for bipolar II Patient reports the following symptoms/concerns: Pt reported anxiety related to upcoming c-section.  Discussed normalizing anxiety around delivery. Discussed ways there are many ways this pregnancy has brought positives to her life.  Discussed ways to be present in the upcoming days and leaning on supports.   Duration of problem: past 5 months; Severity of problem: moderate  Objective: Mood: Euthymic and Affect: Appropriate Risk of harm to self or others: No plan to harm self or others  Life Context: Family and Social:supportive family; hx of childhood trauma Life Changes:pregnancy  Patient and/or Family's Strengths/Protective Factors: Social and Emotional competence  Goals Addressed: Patient will: 1. Reduce symptoms of: depression: pt has hx of suicidal thoughts,endorsed passive SI in previous visits; none reported today; pt has had syx of feeling overwhelmed, sad, angryand stressed, worry thoughts 2. Increase knowledge and/or ability of: self-management skills: to be able to recognize boundaries in giving to others and create self-compassion for self. 3. Demonstrate ability to: Increase healthy adjustment to current life circumstances   Progress towards Goals: Ongoing  Interventions: Interventions utilized:Supportive Counseling and accessing family support  Standardized Assessments completed:Not Needed  Patient and/or Family Response:Pt engaged during appt  Patient Centered Plan: Patient is on the following Treatment Plan(s):Depression management plan Assessment: Patient  currently experiencingdepressive episode due to current adjustments in life. Pt has hx of bipolar.   Patient may benefit fromcontinued supportive therapy, CBTand family support.  PLAN: 1. Follow up with behavioral health clinician on :post delivery  2. Behavioral recommendations:continued self-care and self-compassion; normalize anxiety  3. Referral(s):[facility] (In Clinic)  Royetta Asal, PhD., LMFT-A

## 2020-06-14 ENCOUNTER — Other Ambulatory Visit (HOSPITAL_COMMUNITY): Payer: 59

## 2020-06-14 ENCOUNTER — Encounter (HOSPITAL_COMMUNITY)
Admission: RE | Admit: 2020-06-14 | Discharge: 2020-06-14 | Disposition: A | Discharge status: Home / Self Care | Payer: 59 | Source: Ambulatory Visit | Attending: Obstetrics and Gynecology | Admitting: Obstetrics and Gynecology

## 2020-06-14 ENCOUNTER — Other Ambulatory Visit: Payer: Self-pay

## 2020-06-14 DIAGNOSIS — Z01812 Encounter for preprocedural laboratory examination: Secondary | ICD-10-CM | POA: Insufficient documentation

## 2020-06-14 LAB — TYPE AND SCREEN
ABO/RH(D): O POS
Antibody Screen: NEGATIVE

## 2020-06-14 LAB — CBC
HCT: 29.5 % — ABNORMAL LOW (ref 36.0–46.0)
Hemoglobin: 9.4 g/dL — ABNORMAL LOW (ref 12.0–15.0)
MCH: 25.3 pg — ABNORMAL LOW (ref 26.0–34.0)
MCHC: 31.9 g/dL (ref 30.0–36.0)
MCV: 79.3 fL — ABNORMAL LOW (ref 80.0–100.0)
Platelets: 295 10*3/uL (ref 150–400)
RBC: 3.72 MIL/uL — ABNORMAL LOW (ref 3.87–5.11)
RDW: 16.6 % — ABNORMAL HIGH (ref 11.5–15.5)
WBC: 5.5 10*3/uL (ref 4.0–10.5)
nRBC: 0.4 % — ABNORMAL HIGH (ref 0.0–0.2)

## 2020-06-14 LAB — RPR: RPR Ser Ql: NONREACTIVE

## 2020-06-17 ENCOUNTER — Inpatient Hospital Stay (HOSPITAL_COMMUNITY)
Admission: RE | Admit: 2020-06-17 | Discharge: 2020-06-19 | DRG: 785 | Disposition: A | Discharge status: Home / Self Care | Payer: 59 | Attending: Obstetrics and Gynecology | Admitting: Obstetrics and Gynecology

## 2020-06-17 ENCOUNTER — Inpatient Hospital Stay (HOSPITAL_COMMUNITY): Payer: 59 | Admitting: Anesthesiology

## 2020-06-17 ENCOUNTER — Encounter (HOSPITAL_COMMUNITY)
Admission: RE | Disposition: A | Discharge status: Home / Self Care | Payer: Self-pay | Source: Home / Self Care | Attending: Obstetrics and Gynecology

## 2020-06-17 ENCOUNTER — Encounter (HOSPITAL_COMMUNITY): Payer: Self-pay | Admitting: Obstetrics and Gynecology

## 2020-06-17 ENCOUNTER — Other Ambulatory Visit: Payer: Self-pay

## 2020-06-17 DIAGNOSIS — Z3A39 39 weeks gestation of pregnancy: Secondary | ICD-10-CM

## 2020-06-17 DIAGNOSIS — N736 Female pelvic peritoneal adhesions (postinfective): Secondary | ICD-10-CM

## 2020-06-17 DIAGNOSIS — F3181 Bipolar II disorder: Secondary | ICD-10-CM | POA: Diagnosis present

## 2020-06-17 DIAGNOSIS — D563 Thalassemia minor: Secondary | ICD-10-CM | POA: Diagnosis present

## 2020-06-17 DIAGNOSIS — O34211 Maternal care for low transverse scar from previous cesarean delivery: Secondary | ICD-10-CM | POA: Diagnosis present

## 2020-06-17 DIAGNOSIS — Z302 Encounter for sterilization: Secondary | ICD-10-CM | POA: Diagnosis not present

## 2020-06-17 DIAGNOSIS — Z9851 Tubal ligation status: Secondary | ICD-10-CM

## 2020-06-17 DIAGNOSIS — Z20822 Contact with and (suspected) exposure to covid-19: Secondary | ICD-10-CM | POA: Diagnosis present

## 2020-06-17 DIAGNOSIS — O09521 Supervision of elderly multigravida, first trimester: Secondary | ICD-10-CM | POA: Diagnosis present

## 2020-06-17 DIAGNOSIS — Z87891 Personal history of nicotine dependence: Secondary | ICD-10-CM

## 2020-06-17 DIAGNOSIS — Z98891 History of uterine scar from previous surgery: Secondary | ICD-10-CM

## 2020-06-17 LAB — RESP PANEL BY RT-PCR (FLU A&B, COVID) ARPGX2
Influenza A by PCR: NEGATIVE
Influenza B by PCR: NEGATIVE
SARS Coronavirus 2 by RT PCR: NEGATIVE

## 2020-06-17 SURGERY — Surgical Case
Anesthesia: Spinal

## 2020-06-17 MED ORDER — CEFAZOLIN SODIUM-DEXTROSE 2-4 GM/100ML-% IV SOLN
INTRAVENOUS | Status: AC
  Filled 2020-06-17: qty 100

## 2020-06-17 MED ORDER — FENTANYL CITRATE (PF) 100 MCG/2ML IJ SOLN
INTRAMUSCULAR | Status: DC | PRN
  Administered 2020-06-17: 15 ug via INTRATHECAL

## 2020-06-17 MED ORDER — MEPERIDINE HCL 25 MG/ML IJ SOLN: 6.2500 mg | INTRAMUSCULAR | Status: DC | PRN

## 2020-06-17 MED ORDER — SODIUM CHLORIDE 0.9 % IR SOLN
Status: DC | PRN
  Administered 2020-06-17: 1

## 2020-06-17 MED ORDER — PHENYLEPHRINE HCL-NACL 20-0.9 MG/250ML-% IV SOLN
INTRAVENOUS | Status: AC
  Filled 2020-06-17: qty 250

## 2020-06-17 MED ORDER — NALBUPHINE HCL 10 MG/ML IJ SOLN
5.0000 mg | INTRAMUSCULAR | Status: DC | PRN
Start: 2020-06-17 — End: 2020-06-19

## 2020-06-17 MED ORDER — MENTHOL 3 MG MT LOZG
1.0000 | LOZENGE | OROMUCOSAL | Status: DC | PRN
Start: 2020-06-17 — End: 2020-06-19

## 2020-06-17 MED ORDER — MORPHINE SULFATE (PF) 0.5 MG/ML IJ SOLN
INTRAMUSCULAR | Status: DC | PRN
  Administered 2020-06-17: 150 ug via INTRATHECAL

## 2020-06-17 MED ORDER — ACETAMINOPHEN 500 MG PO TABS: 1000.0000 mg | ORAL_TABLET | Freq: Four times a day (QID) | ORAL | Status: DC

## 2020-06-17 MED ORDER — FENTANYL CITRATE (PF) 100 MCG/2ML IJ SOLN
INTRAMUSCULAR | Status: AC
  Filled 2020-06-17: qty 2

## 2020-06-17 MED ORDER — SOD CITRATE-CITRIC ACID 500-334 MG/5ML PO SOLN
ORAL | Status: AC
  Filled 2020-06-17: qty 30

## 2020-06-17 MED ORDER — DEXMEDETOMIDINE (PRECEDEX) IN NS 20 MCG/5ML (4 MCG/ML) IV SYRINGE
PREFILLED_SYRINGE | INTRAVENOUS | Status: AC
  Filled 2020-06-17: qty 5

## 2020-06-17 MED ORDER — KETOROLAC TROMETHAMINE 30 MG/ML IJ SOLN: 30.0000 mg | Freq: Four times a day (QID) | INTRAMUSCULAR | Status: AC | PRN

## 2020-06-17 MED ORDER — DEXMEDETOMIDINE HCL 200 MCG/2ML IV SOLN
INTRAVENOUS | Status: DC | PRN
  Administered 2020-06-17: 8 ug via INTRAVENOUS

## 2020-06-17 MED ORDER — SIMETHICONE 80 MG PO CHEW: 80.0000 mg | CHEWABLE_TABLET | ORAL | Status: DC | PRN

## 2020-06-17 MED ORDER — TETANUS-DIPHTH-ACELL PERTUSSIS 5-2.5-18.5 LF-MCG/0.5 IM SUSY: 0.5000 mL | PREFILLED_SYRINGE | Freq: Once | INTRAMUSCULAR | Status: DC

## 2020-06-17 MED ORDER — SODIUM CHLORIDE 0.9% FLUSH: 3.0000 mL | INTRAVENOUS | Status: DC | PRN

## 2020-06-17 MED ORDER — ONDANSETRON HCL 4 MG/2ML IJ SOLN
INTRAMUSCULAR | Status: DC | PRN
  Administered 2020-06-17: 4 mg via INTRAVENOUS

## 2020-06-17 MED ORDER — SCOPOLAMINE 1 MG/3DAYS TD PT72
1.0000 | MEDICATED_PATCH | Freq: Once | TRANSDERMAL | Status: DC
  Administered 2020-06-17: 1.5 mg via TRANSDERMAL

## 2020-06-17 MED ORDER — PHENYLEPHRINE HCL-NACL 20-0.9 MG/250ML-% IV SOLN
INTRAVENOUS | Status: DC | PRN
  Administered 2020-06-17: 60 ug/min via INTRAVENOUS

## 2020-06-17 MED ORDER — PRENATAL MULTIVITAMIN CH: 1.0000 | ORAL_TABLET | Freq: Every day | ORAL | Status: DC

## 2020-06-17 MED ORDER — KETOROLAC TROMETHAMINE 30 MG/ML IJ SOLN
30.0000 mg | Freq: Four times a day (QID) | INTRAMUSCULAR | Status: AC | PRN
Start: 2020-06-17 — End: 2020-06-18
  Administered 2020-06-17: 30 mg via INTRAVENOUS

## 2020-06-17 MED ORDER — BUPIVACAINE HCL (PF) 0.5 % IJ SOLN
INTRAMUSCULAR | Status: DC | PRN
  Administered 2020-06-17: 30 mL

## 2020-06-17 MED ORDER — NALBUPHINE HCL 10 MG/ML IJ SOLN
5.0000 mg | Freq: Once | INTRAMUSCULAR | Status: DC | PRN
Start: 2020-06-17 — End: 2020-06-19

## 2020-06-17 MED ORDER — OXYTOCIN-SODIUM CHLORIDE 30-0.9 UT/500ML-% IV SOLN: 2.5000 [IU]/h | INTRAVENOUS | Status: AC

## 2020-06-17 MED ORDER — DIBUCAINE (PERIANAL) 1 % EX OINT
1.0000 | TOPICAL_OINTMENT | CUTANEOUS | Status: DC | PRN
Start: 2020-06-17 — End: 2020-06-19

## 2020-06-17 MED ORDER — ONDANSETRON HCL 4 MG/2ML IJ SOLN
4.0000 mg | Freq: Three times a day (TID) | INTRAMUSCULAR | Status: DC | PRN
Start: 2020-06-17 — End: 2020-06-19

## 2020-06-17 MED ORDER — NALBUPHINE HCL 10 MG/ML IJ SOLN: 5.0000 mg | INTRAMUSCULAR | Status: DC | PRN

## 2020-06-17 MED ORDER — ACETAMINOPHEN 160 MG/5ML PO SOLN: 1000.0000 mg | Freq: Four times a day (QID) | ORAL | Status: DC

## 2020-06-17 MED ORDER — SENNOSIDES-DOCUSATE SODIUM 8.6-50 MG PO TABS
2.0000 | ORAL_TABLET | Freq: Every day | ORAL | Status: DC
  Administered 2020-06-18: 2 via ORAL
  Filled 2020-06-17 (×2): qty 2

## 2020-06-17 MED ORDER — OXYTOCIN-SODIUM CHLORIDE 30-0.9 UT/500ML-% IV SOLN
INTRAVENOUS | Status: DC | PRN
  Administered 2020-06-17: 30 [IU] via INTRAVENOUS

## 2020-06-17 MED ORDER — LACTATED RINGERS IV SOLN: INTRAVENOUS | Status: DC

## 2020-06-17 MED ORDER — COMPLETENATE 29-1 MG PO CHEW
1.0000 | CHEWABLE_TABLET | Freq: Every day | ORAL | Status: DC
  Administered 2020-06-18: 1 via ORAL
  Filled 2020-06-17: qty 1

## 2020-06-17 MED ORDER — COCONUT OIL OIL
1.0000 "application " | TOPICAL_OIL | Status: DC | PRN
  Administered 2020-06-17: 1 via TOPICAL

## 2020-06-17 MED ORDER — FENTANYL CITRATE (PF) 100 MCG/2ML IJ SOLN
25.0000 ug | INTRAMUSCULAR | Status: DC | PRN
Start: 2020-06-17 — End: 2020-06-17
  Administered 2020-06-17 (×2): 50 ug via INTRAVENOUS

## 2020-06-17 MED ORDER — BUPIVACAINE HCL (PF) 0.5 % IJ SOLN
INTRAMUSCULAR | Status: AC
  Filled 2020-06-17: qty 30

## 2020-06-17 MED ORDER — NALOXONE HCL 4 MG/10ML IJ SOLN
1.0000 ug/kg/h | INTRAVENOUS | Status: DC | PRN
  Filled 2020-06-17: qty 5

## 2020-06-17 MED ORDER — NALOXONE HCL 0.4 MG/ML IJ SOLN: 0.4000 mg | INTRAMUSCULAR | Status: DC | PRN

## 2020-06-17 MED ORDER — SCOPOLAMINE 1 MG/3DAYS TD PT72
MEDICATED_PATCH | TRANSDERMAL | Status: AC
  Filled 2020-06-17: qty 1

## 2020-06-17 MED ORDER — IBUPROFEN 100 MG/5ML PO SUSP
800.0000 mg | Freq: Three times a day (TID) | ORAL | Status: DC
  Administered 2020-06-17 – 2020-06-19 (×5): 800 mg via ORAL
  Filled 2020-06-17 (×5): qty 40

## 2020-06-17 MED ORDER — SIMETHICONE 80 MG PO CHEW
80.0000 mg | CHEWABLE_TABLET | Freq: Three times a day (TID) | ORAL | Status: DC
  Administered 2020-06-17 – 2020-06-19 (×4): 80 mg via ORAL
  Filled 2020-06-17 (×4): qty 1

## 2020-06-17 MED ORDER — SOD CITRATE-CITRIC ACID 500-334 MG/5ML PO SOLN
30.0000 mL | ORAL | Status: AC
  Administered 2020-06-17: 30 mL via ORAL

## 2020-06-17 MED ORDER — FENTANYL CITRATE (PF) 100 MCG/2ML IJ SOLN
INTRAMUSCULAR | Status: DC | PRN
Start: 2020-06-17 — End: 2020-06-17
  Administered 2020-06-17: 50 ug via INTRAVENOUS

## 2020-06-17 MED ORDER — IBUPROFEN 800 MG PO TABS: 800.0000 mg | ORAL_TABLET | Freq: Three times a day (TID) | ORAL | Status: DC

## 2020-06-17 MED ORDER — DIPHENHYDRAMINE HCL 25 MG PO CAPS: 25.0000 mg | ORAL_CAPSULE | Freq: Four times a day (QID) | ORAL | Status: DC | PRN

## 2020-06-17 MED ORDER — ACETAMINOPHEN 160 MG/5ML PO SOLN
1000.0000 mg | Freq: Four times a day (QID) | ORAL | Status: DC
  Administered 2020-06-17 – 2020-06-19 (×6): 1000 mg via ORAL
  Filled 2020-06-17 (×5): qty 40.6

## 2020-06-17 MED ORDER — ACETAMINOPHEN 160 MG/5ML PO SOLN
1000.0000 mg | Freq: Four times a day (QID) | ORAL | Status: DC
  Administered 2020-06-17: 1000 mg via ORAL
  Filled 2020-06-17 (×2): qty 40.6

## 2020-06-17 MED ORDER — WITCH HAZEL-GLYCERIN EX PADS: 1.0000 "application " | MEDICATED_PAD | CUTANEOUS | Status: DC | PRN

## 2020-06-17 MED ORDER — POVIDONE-IODINE 10 % EX SWAB
2.0000 "application " | Freq: Once | CUTANEOUS | Status: AC
  Administered 2020-06-17: 2 via TOPICAL

## 2020-06-17 MED ORDER — KETOROLAC TROMETHAMINE 30 MG/ML IJ SOLN
INTRAMUSCULAR | Status: AC
  Filled 2020-06-17: qty 1

## 2020-06-17 MED ORDER — OXYCODONE HCL 5 MG PO TABS
5.0000 mg | ORAL_TABLET | ORAL | Status: DC | PRN
  Administered 2020-06-18 (×2): 10 mg via ORAL
  Administered 2020-06-19: 5 mg via ORAL
  Filled 2020-06-17 (×2): qty 2
  Filled 2020-06-17: qty 1

## 2020-06-17 MED ORDER — ONDANSETRON HCL 4 MG/2ML IJ SOLN
INTRAMUSCULAR | Status: AC
  Filled 2020-06-17: qty 2

## 2020-06-17 MED ORDER — DIPHENHYDRAMINE HCL 25 MG PO CAPS: 25.0000 mg | ORAL_CAPSULE | ORAL | Status: DC | PRN

## 2020-06-17 MED ORDER — MORPHINE SULFATE (PF) 0.5 MG/ML IJ SOLN
INTRAMUSCULAR | Status: AC
  Filled 2020-06-17: qty 10

## 2020-06-17 MED ORDER — OXYTOCIN-SODIUM CHLORIDE 30-0.9 UT/500ML-% IV SOLN
INTRAVENOUS | Status: AC
  Filled 2020-06-17: qty 1000

## 2020-06-17 MED ORDER — ENOXAPARIN SODIUM 40 MG/0.4ML ~~LOC~~ SOLN
40.0000 mg | SUBCUTANEOUS | Status: DC
  Filled 2020-06-17 (×2): qty 0.4

## 2020-06-17 MED ORDER — CEFAZOLIN SODIUM-DEXTROSE 2-4 GM/100ML-% IV SOLN: 2.0000 g | INTRAVENOUS | Status: DC

## 2020-06-17 MED ORDER — DIPHENHYDRAMINE HCL 50 MG/ML IJ SOLN: 12.5000 mg | INTRAMUSCULAR | Status: DC | PRN

## 2020-06-17 MED ORDER — BUPIVACAINE IN DEXTROSE 0.75-8.25 % IT SOLN
INTRATHECAL | Status: DC | PRN
  Administered 2020-06-17: 1.6 mL via INTRATHECAL

## 2020-06-17 MED ORDER — CEFAZOLIN SODIUM-DEXTROSE 2-3 GM-%(50ML) IV SOLR
INTRAVENOUS | Status: DC | PRN
  Administered 2020-06-17: 2 g via INTRAVENOUS

## 2020-06-17 SURGICAL SUPPLY — 43 items
APL SKNCLS STERI-STRIP NONHPOA (GAUZE/BANDAGES/DRESSINGS) ×1
BENZOIN TINCTURE PRP APPL 2/3 (GAUZE/BANDAGES/DRESSINGS) ×2 IMPLANT
CHLORAPREP W/TINT 26ML (MISCELLANEOUS) ×2 IMPLANT
CLAMP CORD UMBIL (MISCELLANEOUS) IMPLANT
CLOTH BEACON ORANGE TIMEOUT ST (SAFETY) ×2 IMPLANT
DRSG OPSITE POSTOP 4X10 (GAUZE/BANDAGES/DRESSINGS) ×2 IMPLANT
ELECT REM PT RETURN 9FT ADLT (ELECTROSURGICAL) ×2
ELECTRODE REM PT RTRN 9FT ADLT (ELECTROSURGICAL) ×1 IMPLANT
EXTRACTOR VACUUM BELL STYLE (SUCTIONS) IMPLANT
GLOVE BIOGEL PI IND STRL 6.5 (GLOVE) ×1 IMPLANT
GLOVE BIOGEL PI IND STRL 7.0 (GLOVE) ×2 IMPLANT
GLOVE BIOGEL PI INDICATOR 6.5 (GLOVE) ×1
GLOVE BIOGEL PI INDICATOR 7.0 (GLOVE) ×2
GLOVE ORTHOPEDIC STR SZ6.5 (GLOVE) ×2 IMPLANT
GOWN STRL REUS W/TWL LRG LVL3 (GOWN DISPOSABLE) ×6 IMPLANT
HEMOSTAT ARISTA ABSORB 3G PWDR (HEMOSTASIS) ×1 IMPLANT
KIT ABG SYR 3ML LUER SLIP (SYRINGE) IMPLANT
NDL HYPO 25X1 1.5 SAFETY (NEEDLE) IMPLANT
NEEDLE HYPO 22GX1.5 SAFETY (NEEDLE) ×3 IMPLANT
NEEDLE HYPO 25X1 1.5 SAFETY (NEEDLE) IMPLANT
NS IRRIG 1000ML POUR BTL (IV SOLUTION) ×2 IMPLANT
PACK C SECTION WH (CUSTOM PROCEDURE TRAY) ×2 IMPLANT
PAD ABD 7.5X8 STRL (GAUZE/BANDAGES/DRESSINGS) ×1 IMPLANT
PAD OB MATERNITY 4.3X12.25 (PERSONAL CARE ITEMS) ×2 IMPLANT
PENCIL SMOKE EVAC W/HOLSTER (ELECTROSURGICAL) IMPLANT
SPONGE GAUZE 4X4 12PLY STER LF (GAUZE/BANDAGES/DRESSINGS) ×2 IMPLANT
STRIP CLOSURE SKIN 1/2X4 (GAUZE/BANDAGES/DRESSINGS) IMPLANT
SUT MON AB 4-0 PS1 27 (SUTURE) ×2 IMPLANT
SUT PDS AB 0 CTX 60 (SUTURE) ×2 IMPLANT
SUT PLAIN 0 NONE (SUTURE) IMPLANT
SUT PLAIN 2 0 (SUTURE) ×2
SUT PLAIN ABS 2-0 CT1 27XMFL (SUTURE) ×1 IMPLANT
SUT VIC AB 0 CT1 36 (SUTURE) ×4 IMPLANT
SUT VIC AB 0 CTX 36 (SUTURE) ×2
SUT VIC AB 0 CTX36XBRD ANBCTRL (SUTURE) ×1 IMPLANT
SUT VIC AB 2-0 SH 27 (SUTURE) ×8
SUT VIC AB 2-0 SH 27XBRD (SUTURE) IMPLANT
SUT VIC AB 4-0 KS 27 (SUTURE) ×1 IMPLANT
SYR 10ML LL (SYRINGE) ×1 IMPLANT
SYR CONTROL 10ML LL (SYRINGE) ×2 IMPLANT
TOWEL OR 17X24 6PK STRL BLUE (TOWEL DISPOSABLE) ×2 IMPLANT
TRAY FOLEY W/BAG SLVR 14FR LF (SET/KITS/TRAYS/PACK) ×2 IMPLANT
WATER STERILE IRR 1000ML POUR (IV SOLUTION) ×2 IMPLANT

## 2020-06-17 NOTE — Op Note (Signed)
Dana Hart PROCEDURE DATE: 06/17/2020  PREOPERATIVE DIAGNOSES: Intrauterine pregnancy at [redacted]w[redacted]d weeks gestation; h/o Cesarean x2, Unwanted Fertility  POSTOPERATIVE DIAGNOSES: The same, Moderate left-sided uterine adhesions  PROCEDURE: Repeat Low Transverse Cesarean Section with Bilateral Tubal Ligation (Right Salpingectomy, Left [place]oy)  SURGEON:  Dr. Leroy Libman (Attending)            Dr. [name] (Fellow)  ANESTHESIOLOGY TEAM: Anesthesiologist: Marcene Duos, MD CRNA: Elgie Congo, CRNA  INDICATIONS: Dana Hart is a 35 y.o. [phone] at [redacted]w[redacted]d here for cesarean section secondary to the indications listed under preoperative diagnoses; please see preoperative note for further details. The risks of cesarean section were discussed with the patient including but were not limited to: bleeding which may require transfusion or reoperation; infection which may require antibiotics; injury to bowel, bladder, ureters or other surrounding organs; injury to the fetus; need for additional procedures including hysterectomy in the event of a life-threatening hemorrhage; placental abnormalities wth subsequent pregnancies, incisional problems, thromboembolic phenomenon and other postoperative/anesthesia complications.  Patient also desires permanent sterilization.  Other reversible forms of contraception were discussed with patient; she declines all other modalities. Risks of procedure discussed with patient including but not limited to: risk of regret, permanence of method, bleeding, infection, injury to surrounding organs and need for additional procedures.  Failure risk of about 1% with increased risk of ectopic gestation if pregnancy occurs was also discussed with patient.  Also discussed possibility of post-tubal pain syndrome. The patient concurred with the proposed plan, giving informed written consent for the procedures.    FINDINGS:  Viable female infant in cephalic presentation.  Apgars  9 and 9.  Clear amniotic fluid.  Intact placenta, three vessel cord.  Normal uterus, fallopian tubes and ovaries bilaterally. Moderate left-sided uterine adhesions.  ANESTHESIA: Spinal INTRAVENOUS FLUIDS: 2500 ml   ESTIMATED BLOOD LOSS: 561 ml URINE OUTPUT:  300 ml SPECIMENS: Placenta sent to L&D, Complete right tube & partial left tube COMPLICATIONS: None immediate  PROCEDURE IN DETAIL:  The patient preoperatively received intravenous antibiotics (ancef) and had sequential compression devices applied to her lower extremities.  She was then taken to the operating room where spinal anesthesia was administered and was found to be adequate. She was then placed in a dorsal supine position with a leftward tilt, and prepped and draped in a sterile manner.  A foley catheter was placed into her bladder and attached to constant gravity.  After an adequate timeout was performed, a Pfannenstiel skin incision was made with scalpel along her preexisting scar and carried through to the underlying layer of fascia. The fascia was incised in the midline, and this incision was extended bilaterally using the Mayo scissors.  Kocher clamps were applied to the inferior aspect of the fascial incision and the underlying rectus muscles were dissected off bluntly and sharply.  A similar process was carried out on the superior aspect of the fascial incision with minimal adhesions noted. The rectus muscles were separated in the midline and the peritoneum was entered bluntly. The Alexis self-retaining retractor was introduced into the abdominal cavity and bladder blade was inserted. Moderate left-sided uterine adhesions were noted.  Attention was turned to the lower uterine segment where a low transverse hysterotomy was made with a scalpel and extended bilaterally bluntly.  The infant was successfully delivered, the cord was clamped and cut after one minute, and the infant was handed over to the awaiting neonatology team. Uterine  massage was then administered, and the placenta delivered intact  with a three-vessel cord. The uterus was then cleared of clots and debris.  The hysterotomy was closed with 0 Vicryl in a running locked fashion, and a vertical imbricating layer was also placed with 0 Vicryl.   The right Fallopian tube was identified by tracing out to the fimbraie, grasped with the Babcock clamps. Salpingectomy was performed using the clamp, cut, ligate method with use of 2-0 vicryl. Good hemostasis was noted after. The left Fallopian tube was then identified, grasped with the Babcock clamps. Given moderate left-sided uterine adhesions, an avascular midsection of the left tube approximately 3-4cm from the cornua was grasped with the babcock clamps and brought into a knuckle. The tube was double ligated with one 0 plain gut suture and the intervening portion of left tube was transected and removed. Good hemostasis was noted after.   Arista was administered along the hysterotomy site. The pelvis was cleared of all clot and debris. Hemostasis was confirmed on all surfaces and bilateral BTL sites. The retractor was removed. The fascia was then closed using PDS in a running fashion.  The subcutaneous layer was irrigated and reapproximated with 2-0 plain gut interrupted stitches, and the skin was closed with a 4-0 Vicryl subcuticular stitch. The patient tolerated the procedure well. Sponge, instrument and needle counts were correct x 3.  She was taken to the recovery room in stable condition.   Sheila Oats, MD OB Fellow, Faculty Practice 06/17/2020 11:19 AM

## 2020-06-17 NOTE — Anesthesia Preprocedure Evaluation (Signed)
Anesthesia Evaluation  Patient identified by MRN, date of birth, ID band Patient awake    Reviewed: Allergy & Precautions, NPO status , Patient's Chart, lab work & pertinent test results  Airway Mallampati: II  TM Distance: >3 FB Neck ROM: Full    Dental  (+) Dental Advisory Given   Pulmonary asthma , former smoker,    breath sounds clear to auscultation       Cardiovascular negative cardio ROS   Rhythm:Regular Rate:Normal     Neuro/Psych negative neurological ROS     GI/Hepatic negative GI ROS, Neg liver ROS,   Endo/Other  negative endocrine ROS  Renal/GU negative Renal ROS     Musculoskeletal   Abdominal   Peds  Hematology  (+) anemia ,   Anesthesia Other Findings   Reproductive/Obstetrics (+) Pregnancy                             Anesthesia Physical Anesthesia Plan  ASA: II  Anesthesia Plan: Spinal   Post-op Pain Management:    Induction:   PONV Risk Score and Plan: 2 and Treatment may vary due to age or medical condition  Airway Management Planned: Natural Airway and Simple Face Mask  Additional Equipment:   Intra-op Plan:   Post-operative Plan:   Informed Consent: I have reviewed the patients History and Physical, chart, labs and discussed the procedure including the risks, benefits and alternatives for the proposed anesthesia with the patient or authorized representative who has indicated his/her understanding and acceptance.       Plan Discussed with: CRNA  Anesthesia Plan Comments:         Anesthesia Quick Evaluation

## 2020-06-17 NOTE — H&P (Addendum)
OBSTETRIC ADMISSION HISTORY AND PHYSICAL  Dana Hart is a 35 y.o. female [phone] with IUP at 74w0dby L/7 presenting for scheduled repeat Cesarean. She reports +FMs, No LOF, no VB, no blurry vision, headaches or peripheral edema, and RUQ pain.  She plans on breast feeding. She requests BTL for birth control.  She received her prenatal care at [facility]  Dating: By L/7 --->  Estimated Date of Delivery: 06/24/20  Sono:  _0 , CWD, normal anatomy, cephalic presentation, 26160V 74% EFW  Prenatal History/Complications:  - Bipolar Depression - alpha thal carrier - h/o Cesarean x2   Past Medical History: Past Medical History:  Diagnosis Date  . Asthma 1990   had it as a child but not having any current issues or taking any medicine  . Depression    [facility] but had to stop seeing her due to  loss of insurance  . 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 CESAREAN SECTION  2007   birth of son. Heart rate dropped with pitocin. Emergency c section  . CESAREAN SECTION  2010   birth of daughter. Not progressing    Obstetrical History: OB History    Gravida  4   Para  2   Term  2   Preterm  0   AB  1   Living  2     SAB      IAB  1   Ectopic      Multiple      Live Births  2        Obstetric Comments  Abortion at age 35 medical        Social History Social History   Socioeconomic History  . Marital status: Single    Spouse name: Not on file  . Number of children: 2  . Years of education: 151 . Highest education level: Not on file  Occupational History  . Occupation: stay at home mom    Comment: used to work at [employer]  . Smoking status: Former Smoker    Quit date: 08/15/2014    Years since quitting: 5.8  . Smokeless tobacco: Never Used  Vaping Use   . Vaping Use: Never used  Substance and Sexual Activity  . Alcohol use: Not Currently    Alcohol/week: 0.0 standard drinks    Comment: occasional  . Drug use: No  . Sexual activity: Yes    Partners: Male    Birth control/protection: None  Other Topics Concern  . Not on file  Social History Narrative  . Not on file   Social Determinants of Health   Financial Resource Strain: Not on file  Food Insecurity: Not on file  Transportation Needs: Not on file  Physical Activity: Not on file  Stress: Not on file  Social Connections: Not on file    Family History: Family History  Problem Relation Age of Onset  . Mental illness Brother        scyzoaffective disorder  . Asthma Brother   . Depression Mother   . Hypothyroidism Mother   . Hepatitis Mother        hepatitis C  . Fibroids Mother        uterine fibroids  . Hypertension Maternal Grandmother   . Hypertension Maternal Grandfather   .  Hypertension Paternal Grandmother   . Hypertension Paternal Grandfather   . Seizures Father        epilepsy  . Hypothyroidism Father   . Depression Father   . Anxiety disorder Father     Allergies: Allergies  Allergen Reactions  . Penicillins Other (See Comments)    Childhood reaction.    Medications Prior to Admission  Medication Sig Dispense Refill Last Dose  . Prenatal Vit-Fe Phos-FA-Omega (VITAFOL GUMMIES) 3.33-0.333-34.8 MG CHEW Chew 1 tablet by mouth daily. 90 tablet 6   . Blood Pressure Monitoring (BLOOD PRESSURE KIT) DEVI 1 kit by Does not apply route daily. 1 each 0   . metroNIDAZOLE (FLAGYL) 500 MG tablet Take 1 tablet (500 mg total) by mouth 2 (two) times daily. 14 tablet 0   . Misc. Devices (WRIST BRACE) MISC 1 each by Other route daily. Apply wrist brace to left wrist daily. 1 each 0   . Misc. Devices (WRIST BRACE) MISC 1 each by Other route daily. Apply wrist brace to Right wrist daily. 1 each 0   . pantoprazole (PROTONIX) 40 MG tablet Take 1 tablet (40 mg total) by  mouth daily. 30 tablet 3 Not Taking at Unknown time  . terconazole (TERAZOL 7) 0.4 % vaginal cream Place 1 applicator vaginally at bedtime. 45 g 0      Review of Systems   All systems reviewed and negative except as stated in HPI  Blood pressure 136/81, pulse 93, temperature 98.3 F (36.8 C), temperature source Oral, resp. rate 16, height _0  (1.549 m), weight 76.7 kg, last menstrual period 09/18/2019, SpO2 100 %, unknown if currently breastfeeding. General appearance: alert, cooperative and appears stated age Lungs: normal WOB Heart: regular rate  Abdomen: soft, non-tender Extremities: no sign of DVT Presentation: cephalic Fetal monitoring: FHT 135 by Dopplers    Prenatal labs: ABO, Rh: --/--/O POS (12/30 0935) Antibody: NEG (12/30 0935) Rubella: 4.81 (05/24 1123) RPR: NON REACTIVE (12/30 0932)  HBsAg: Negative (05/24 1123)  HIV: Non Reactive (10/21 1020)  GBS: Negative/-- (12/17 0857)  2 hr Glucola: wnl Genetic screening: wnl except for alpha thal carrier Anatomy US: wnl except for bilateral choroid plexus cysts (now resolved)  Prenatal Transfer Tool  Maternal Diabetes: No Genetic Screening: Normal except for alpha thal carrier Maternal Ultrasounds/Referrals: Normal (except for bilateral choroid plexus cysts--now resolved) Fetal Ultrasounds or other Referrals:  None Maternal Substance Abuse:  No Significant Maternal Medications:  None Significant Maternal Lab Results: Group B Strep negative  Results for orders placed or performed during the hospital encounter of 06/17/20 (from the past 24 hour(s))  Resp Panel by RT-PCR (Flu A&B, Covid) Nasopharyngeal Swab   Collection Time: 06/17/20  7:32 AM   Specimen: Nasopharyngeal Swab; Nasopharyngeal(NP) swabs in vial transport medium  Result Value Ref Range   SARS Coronavirus 2 by RT PCR NEGATIVE NEGATIVE   Influenza A by PCR NEGATIVE NEGATIVE   Influenza B by PCR NEGATIVE NEGATIVE    Patient Active Problem List    Diagnosis Date Noted  . H/O: C-section 06/17/2020  . History of 2 cesarean sections 05/17/2020  . Previous cesarean delivery affecting pregnancy 02/09/2020  . 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 Encounter for supervision of normal pregnancy, unspecified, unspecified trimester 12/07/2019  . Suicide risk 11/28/2019  . Supervision of high-risk pregnancy 11/21/2019  . Advanced maternal age in multigravida, first trimester 11/21/2019  . History of prior pregnancy with SGA newborn 11/21/2019  . Positive pregnancy test 11/01/2019  . Onychomycosis 03/06/2015  .  Tobacco use disorder 09/21/2013  . Bipolar 2 disorder ([place]) 04/25/2013  . Depression 06/13/2011    Assessment/Plan:  SHOSHANA JOHAL is a 35 y.o. [id number] at 98w0dhere for scheduled repeat Cesarean.  #Scheduled Repeat Cesarean: Pt has history of Cesarean x2 (first Cesarean secondary to fetal intolerance and second Cesarean secondary to arrest of dilation in 2010).  The risks of cesarean section were discussed with the patient including but were not limited to: bleeding which may require transfusion or reoperation; infection which may require antibiotics; injury to bowel, bladder, ureters or other surrounding organs; injury to the fetus; need for additional procedures including hysterectomy in the event of a life-threatening hemorrhage; placental abnormalities wth subsequent pregnancies, incisional problems, thromboembolic phenomenon and other postoperative/anesthesia complications.  Patient also desires permanent sterilization.  Other reversible forms of contraception were discussed with patient; she declines all other modalities. Risks of procedure discussed with patient including but not limited to: risk of regret, permanence of method, bleeding, infection, injury to surrounding organs and need for additional procedures.  Failure risk of about 1% with increased risk of ectopic gestation if pregnancy occurs was also discussed with patient.   Also discussed possibility of post-tubal pain syndrome. The patient concurred with the proposed plan, giving informed written consent for the procedures.  Patient has been NPO since yesterday; she will remain NPO for procedure. Anesthesia and OR aware.  Preoperative prophylactic antibiotics and SCDs ordered on call to the OR.  To OR when ready.  #Pain: Per anesthesia #FWB:  FHT 135 on Dopplers, +FM reported #ID: GBS negative; plan for ancef prior to OR #MOF: breast #MOC: desires postpartum BTL as noted above #Circ: n/a #Alpha thal carrier: peds aware  KSloan Leiter MD  06/17/2020, 8:58 AM  Attestation of Attending Supervision of OB Fellow: Evaluation, management, and procedures were performed by the [facility] under my supervision and collaboration.  I have reviewed the OB Fellow's note and chart, and I agree with the management and plan.  Patient affirms desire for tubal ligation again today.  The risks of cesarean section were discussed with the patient; including but not limited to: infection which may require antibiotics; bleeding which may require transfusion or re-operation; injury to bowel, bladder, ureters or other surrounding organs; need for additional procedures including hysterectomy in the event of a life-threatening hemorrhage; placental abnormalities wth subsequent pregnancies, risk of needing c-sections in future pregnancies, incisional problems, thromboembolic phenomenon and other postoperative/anesthesia complications. She is agreeable to blood transfusion in the event of emergency.  Reviewed risks of bilateral tubal ligation including infection, hemorrhage, damage to surrounding tissue and organs, risk of regret. Reviewed bilateral tubal ligation failure rate of ~1%, and that there is a slightly higher risk of ectopic after tubal ligation; if she has any reason to believe she is pregnant, she should take a pregnancy test. She understands this is an elective procedure and again  affirms her desire. Answered all questions. The patient verbalized understanding of the plan, giving informed consent for the procedure.   Patient has been NPO since 12 am, she will remain NPO for procedure Anesthesia and OR aware Preoperative prophylactic antibiotics and SCDs ordered on call to the OR  To OR when ready   K. MArvilla Meres MD, [facility](Faculty Practice)  06/17/2020 8:42 AM

## 2020-06-17 NOTE — Anesthesia Postprocedure Evaluation (Signed)
Anesthesia Post Note  Patient: Dana Hart  Procedure(s) Performed: CESAREAN SECTION WITH BILATERAL TUBAL LIGATION (N/A )     Patient location during evaluation: PACU Anesthesia Type: Spinal Level of consciousness: awake and alert Pain management: pain level controlled Vital Signs Assessment: post-procedure vital signs reviewed and stable Respiratory status: spontaneous breathing and respiratory function stable Cardiovascular status: blood pressure returned to baseline and stable Postop Assessment: spinal receding Anesthetic complications: no   No complications documented.  Last Vitals:  Vitals:   06/17/20 1200 06/17/20 1215  BP: 123/84 127/88  Pulse: 85 81  Resp: 13 12  Temp:    SpO2: 97% 98%    Last Pain:  Vitals:   06/17/20 1215  TempSrc:   PainSc: 5    Pain Goal:    LLE Motor Response: Purposeful movement (06/17/20 1215)   RLE Motor Response: Purposeful movement (06/17/20 1215)       Epidural/Spinal Function Cutaneous sensation: Able to Wiggle Toes (06/17/20 1215), Patient able to flex knees: Yes (06/17/20 1215), Patient able to lift hips off bed: Yes (06/17/20 1215), Back pain beyond tenderness at insertion site: No (06/17/20 1215), Progressively worsening motor and/or sensory loss: No (06/17/20 1215), Bowel and/or bladder incontinence post epidural: No (06/17/20 1215)  Kennieth Rad

## 2020-06-17 NOTE — Discharge Instructions (Signed)
Postpartum Care After Cesarean Delivery °This sheet gives you information about how to care for yourself from the time you deliver your baby to up to 6-12 weeks after delivery (postpartum period). Your health care provider may also give you more specific instructions. If you have problems or questions, contact your health care provider. °Follow these instructions at home: °Medicines °· Take over-the-counter and prescription medicines only as told by your health care provider. °· If you were prescribed an antibiotic medicine, take it as told by your health care provider. Do not stop taking the antibiotic even if you start to feel better. °· Ask your health care provider if the medicine prescribed to you: °? Requires you to avoid driving or using heavy machinery. °? Can cause constipation. You may need to take actions to prevent or treat constipation, such as: °§ Drink enough fluid to keep your urine pale yellow. °§ Take over-the-counter or prescription medicines. °§ Eat foods that are high in fiber, such as beans, whole grains, and fresh fruits and vegetables. °§ Limit foods that are high in fat and processed sugars, such as fried or sweet foods. °Activity °· Gradually return to your normal activities as told by your health care provider. °· Avoid activities that take a lot of effort and energy (are strenuous) until approved by your health care provider. Walking at a slow to moderate pace is usually safe. Ask your health care provider what activities are safe for you. °? Do not lift anything that is heavier than your baby or 10 lb (4.5 kg) as told by your health care provider. °? Do not vacuum, climb stairs, or drive a car for as long as told by your health care provider. °· If possible, have someone help you at home until you are able to do your usual activities yourself. °· Rest as much as possible. Try to rest or take naps while your baby is sleeping. °Vaginal bleeding °· It is normal to have vaginal bleeding  (lochia) after delivery. Wear a sanitary pad to absorb vaginal bleeding and discharge. °? During the first week after delivery, the amount and appearance of lochia is often similar to a menstrual period. °? Over the next few weeks, it will gradually decrease to a dry, yellow-brown discharge. °? For most women, lochia stops completely by 4-6 weeks after delivery. Vaginal bleeding can vary from woman to woman. °· Change your sanitary pads frequently. Watch for any changes in your flow, such as: °? A sudden increase in volume. °? A change in color. °? Large blood clots. °· If you pass a blood clot, save it and call your health care provider to discuss. Do not flush blood clots down the toilet before you get instructions from your health care provider. °· Do not use tampons or douches until your health care provider says this is safe. °· If you are not breastfeeding, your period should return 6-8 weeks after delivery. If you are breastfeeding, your period may return anytime between 8 weeks after delivery and the time that you stop breastfeeding. °Perineal care ° °· If your C-section (Cesarean section) was unplanned, and you were allowed to labor and push before delivery, you may have pain, swelling, and discomfort of the tissue between your vaginal opening and your anus (perineum). You may also have an incision in the tissue (episiotomy) or the tissue may have torn during delivery. Follow these instructions as told by your health care provider: °? Keep your perineum clean and dry as told by   your health care provider. Use medicated pads and pain-relieving sprays and creams as directed. °? If you have an episiotomy or vaginal tear, check the area every day for signs of infection. Check for: °§ Redness, swelling, or pain. °§ Fluid or blood. °§ Warmth. °§ Pus or a bad smell. °? You may be given a squirt bottle to use instead of wiping to clean the perineum area after you go to the bathroom. As you start healing, you may use  the squirt bottle before wiping yourself. Make sure to wipe gently. °? To relieve pain caused by an episiotomy, vaginal tear, or hemorrhoids, try taking a warm sitz bath 2-3 times a day. A sitz bath is a warm water bath that is taken while you are sitting down. The water should only come up to your hips and should cover your buttocks. °Breast care °· Within the first few days after delivery, your breasts may feel heavy, full, and uncomfortable (breast engorgement). You may also have milk leaking from your breasts. Your health care provider can suggest ways to help relieve breast discomfort. Breast engorgement should go away within a few days. °· If you are breastfeeding: °? Wear a bra that supports your breasts and fits you well. °? Keep your nipples clean and dry. Apply creams and ointments as told by your health care provider. °? You may need to use breast pads to absorb milk leakage. °? You may have uterine contractions every time you breastfeed for several weeks after delivery. Uterine contractions help your uterus return to its normal size. °? If you have any problems with breastfeeding, work with your health care provider or a lactation consultant. °· If you are not breastfeeding: °? Avoid touching your breasts as this can make your breasts produce more milk. °? Wear a well-fitting bra and use cold packs to help with swelling. °? Do not squeeze out (express) milk. This causes you to make more milk. °Intimacy and sexuality °· Ask your health care provider when you can engage in sexual activity. This may depend on your: °? Risk of infection. °? Healing rate. °? Comfort and desire to engage in sexual activity. °· You are able to get pregnant after delivery, even if you have not had your period. If desired, talk with your health care provider about methods of family planning or birth control (contraception). °Lifestyle °· Do not use any products that contain nicotine or tobacco, such as cigarettes, e-cigarettes,  and chewing tobacco. If you need help quitting, ask your health care provider. °· Do not drink alcohol, especially if you are breastfeeding. °Eating and drinking ° °· Drink enough fluid to keep your urine pale yellow. °· Eat high-fiber foods every day. These may help prevent or relieve constipation. High-fiber foods include: °? Whole grain cereals and breads. °? Brown rice. °? Beans. °? Fresh fruits and vegetables. °· Take your prenatal vitamins until your postpartum checkup or until your health care provider tells you it is okay to stop. °General instructions °· Keep all follow-up visits for you and your baby as told by your health care provider. Most women visit their health care provider for a postpartum checkup within the first 3-6 weeks after delivery. °Contact a health care provider if you: °· Feel unable to cope with the changes that a new baby brings to your life, and these feelings do not go away. °· Feel unusually sad or worried. °· Have breasts that are painful, hard, or turn red. °· Have a fever. °·   Have trouble holding urine or keeping urine from leaking.  Have little or no interest in activities you used to enjoy.  Have not breastfed at all and you have not had a menstrual period for 12 weeks after delivery.  Have stopped breastfeeding and you have not had a menstrual period for 12 weeks after you stopped breastfeeding.  Have questions about caring for yourself or your baby.  Pass a blood clot from your vagina. Get help right away if you:  Have chest pain.  Have difficulty breathing.  Have sudden, severe leg pain.  Have severe pain or cramping in your abdomen.  Bleed from your vagina so much that you fill more than one sanitary pad in one hour. Bleeding should not be heavier than your heaviest period.  Develop a severe headache.  Faint.  Have blurred vision or spots in your vision.  Have a bad-smelling vaginal discharge.  Have thoughts about hurting yourself or your  baby. If you ever feel like you may hurt yourself or others, or have thoughts about taking your own life, get help right away. You can go to your nearest emergency department or call:  Your local emergency services (911 in the [place]  A suicide crisis helpline, such as the National Suicide Prevention Lifeline at 504-858-1083. This is open 24 hours a day. Summary  The period of time from when you deliver your baby to up to 6-12 weeks after delivery is called the postpartum period.  Gradually return to your normal activities as told by your health care provider.  Keep all follow-up visits for you and your baby as told by your health care provider. This information is not intended to replace advice given to you by your health care provider. Make sure you discuss any questions you have with your health care provider. Document Revised: 01/20/2018 Document Reviewed: 01/20/2018 Elsevier Patient Education  2020 [employer]   Postpartum Tubal Ligation, Care After This sheet gives you information about how to care for yourself after your procedure. Your health care provider may also give you more specific instructions. If you have problems or questions, contact your health care provider. What can I expect after the procedure? After the procedure, you may have:  A sore throat.  Bruising or pain in your back.  Nausea or vomiting.  Dizziness.  Mild abdominal discomfort or pain, such as cramping, gas pain, or feeling bloated.  Soreness around the incision area.  Tiredness.  Pain in your shoulders. Follow these instructions at home: Medicines  Ask your health care provider if the medicine prescribed to you: ? Requires you to avoid driving or using heavy machinery. ? Can cause constipation. You may need to take actions to prevent or treat constipation, such as:  Drink enough fluid to keep your urine pale yellow.  Take over-the-counter or prescription medicines.  Eat foods that  are high in fiber, such as beans, whole grains, and fresh fruits and vegetables.  Limit foods that are high in fat and processed sugars, such as fried or sweet foods.  Do not take aspirin because it can cause bleeding. Activity  Rest for the remainder of the day.  Return to your normal activities as told by your health care provider. Ask your health care provider what activities are safe for you.  Do not have sex, douche, or put a tampon or anything else in your vagina for 6 weeks or as long as told by your health care provider.  Do not lift anything that  is heavier than your baby for 2 weeks, or the limit that you are told, until your health care provider says that it is safe. Incision care      Follow instructions from your health care provider about how to take care of your incision. Make sure you: ? Wash your hands with soap and water before and after you change your bandage (dressing). If soap and water are not available, use hand sanitizer. ? Change your dressing as told by your health care provider. ? Leave stitches (sutures), skin glue, or adhesive strips in place. These skin closures may need to stay in place for 2 weeks or longer. If adhesive strip edges start to loosen and curl up, you may trim the loose edges. Do not remove adhesive strips completely unless your health care provider tells you to do that.  Check your incision area every day for signs of infection. Check for: ? Redness, swelling, or pain. ? Fluid or blood. ? Warmth. ? Pus or a bad smell. Other Instructions  Do not take baths, swim, or use a hot tub until your health care provider approves. Ask your health care provider if you may take showers. You may only be allowed to take sponge baths.  Keep all follow-up visits as told by your health care provider. This is important. Contact a health care provider if:  You have redness, swelling, or pain around your incision.  Your incision feels warm to the  touch.  Your pain does not improve after 2-3 days.  You have a rash.  You repeatedly become dizzy or lightheaded.  Your pain medicine is not helping.  You are constipated. Get help right away if you:  Have a fever.  Faint.  Have pain in your abdomen that gets worse.  Have fluid or blood coming from your incision.  You have pus or a bad smell coming from your incision.  The edges of your incision break open after the sutures have been removed.  Have shortness of breath or trouble breathing.  Have chest pain or leg pain.  Have ongoing nausea or diarrhea. Summary  Mild abdominal discomfort is common after this procedure.  Contact your health care provider if you experience problems or have concerns.  Do not lift anything that is heavier than your baby for 2 weeks, or the limit that you are told, until your health care provider says that it is safe.  Keep all follow-up visits as told by your health care provider. This is important. This information is not intended to replace advice given to you by your health care provider. Make sure you discuss any questions you have with your health care provider. Document Revised: 11/15/2018 Document Reviewed: 04/22/2018 Elsevier Patient Education  2020 [employer].

## 2020-06-17 NOTE — Transfer of Care (Signed)
Immediate Anesthesia Transfer of Care Note  Patient: Dana Hart  Procedure(s) Performed: CESAREAN SECTION WITH BILATERAL TUBAL LIGATION (N/A )  Patient Location: PACU  Anesthesia Type:Spinal  Level of Consciousness: awake, alert  and oriented  Airway & Oxygen Therapy: Patient Spontanous Breathing  Post-op Assessment: Report given to RN and Post -op Vital signs reviewed and stable  Post vital signs: Reviewed and stable  Last Vitals:  Vitals Value Taken Time  BP    Temp    Pulse 82 06/17/20 1126  Resp 16 06/17/20 1126  SpO2 99 % 06/17/20 1126  Vitals shown include unvalidated device data.  Last Pain:  Vitals:   06/17/20 0745  TempSrc: Oral         Complications: No complications documented.

## 2020-06-17 NOTE — Discharge Summary (Signed)
Postpartum Discharge Summary    Patient Name: Dana Hart DOB: 10/14/1985 MRN: [id number]  Date of admission: 06/17/2020 Delivery date:06/17/2020  Delivering provider: Sloan Leiter  Date of discharge: 06/19/2020  Admitting diagnosis: H/O: C-section [Z98.891] Intrauterine pregnancy: [redacted]w[redacted]d    Secondary diagnosis:  Active Problems:   Bipolar 2 disorder ([place]   Advanced maternal age in multigravida, first trimester   Thalassemia alpha carrier   H/O: C-section   Cesarean delivery delivered   History of bilateral tubal ligation  Additional problems: as noted above Discharge diagnosis: Cesarean delivery delivered                                   Post partum procedures:postpartum tubal ligation, IV iron infusion Augmentation: N/A Complications: None  Hospital course: Sceduled C/S   35y.o. yo [id number] admitted to the hospital 06/17/2020 for scheduled cesarean section with the following indication:H/o Cesarean x2. Delivery details are as follows:  Membrane Rupture Time/Date: 10:13 AM ,06/17/2020   Delivery Method:C-Section, Low Transverse  with postpartum Bilateral Tubal Ligation. Details of operation can be found in separate operative note.  Patient had an uncomplicated postpartum course.  She is ambulating, tolerating a regular diet, passing flatus, and urinating well. Patient is discharged home in stable condition on  06/19/20. [place] Score 14 prior to discharge. Plan for mood check in 1 week.        Newborn Data: Birth date:06/17/2020  Birth time:10:13 AM  Gender:Female  Living status:Living  Apgars:9 ,9  Weight:3750 g     Magnesium Sulfate received: No BMZ received: No Rhophylac:N/A MMR:N/A T-DaP: offered prior to discharge Flu: offered prior to discharge Transfusion:Yes, IV iron  Physical exam  Vitals:   06/18/20 0227 06/18/20 0400 06/18/20 2335 06/19/20 0555  BP:  120/64 (!) 122/56 (!) 118/59  Pulse: 82 86 79 81  Resp: 18 18 18 18   Temp:  97.7 F (36.5  C) 99.2 F (37.3 C) 99.2 F (37.3 C)  TempSrc:  Oral Oral Oral  SpO2: 100% 100%    Weight:      Height:       General: alert, cooperative and no distress Lochia: appropriate Uterine Fundus: firm Incision: Healing well with no significant drainage, No significant erythema, Dressing is clean, dry, and intact DVT Evaluation: No evidence of DVT seen on physical exam. No cords or calf tenderness. No significant calf/ankle edema. Labs: Lab Results  Component Value Date   WBC 7.2 06/18/2020   HGB 7.5 (L) 06/18/2020   HCT 24.6 (L) 06/18/2020   MCV 80.1 06/18/2020   PLT 244 06/18/2020   CMP Latest Ref Rng & Units 11/07/2019  Glucose 65 - 99 mg/dL 81  BUN 6 - 20 mg/dL 5(L)  Creatinine 0.57 - 1.00 mg/dL 0.56(L)  Sodium 134 - 144 mmol/L 137  Potassium 3.5 - 5.2 mmol/L 3.9  Chloride 96 - 106 mmol/L 102  CO2 20 - 29 mmol/L 18(L)  Calcium 8.7 - 10.2 mg/dL 9.5  Total Protein 6.1 - 8.1 g/dL -  Total Bilirubin 0.2 - 1.2 mg/dL -  Alkaline Phos 33 - 115 U/L -  AST 10 - 30 U/L -  ALT 6 - 29 U/L -   Edinburgh Score: Edinburgh Postnatal Depression Scale Screening Tool 06/18/2020  I have been able to laugh and see the funny side of things. 0  I have looked forward with enjoyment to things. 1  I have blamed myself unnecessarily when things went wrong. 3  I have been anxious or worried for no good reason. 2  I have felt scared or panicky for no good reason. 2  Things have been getting on top of me. 2  I have been so unhappy that I have had difficulty sleeping. 1  I have felt sad or miserable. 1  I have been so unhappy that I have been crying. 1  The thought of harming myself has occurred to me. 1  Edinburgh Postnatal Depression Scale Total 14     After visit meds:  Allergies as of 06/19/2020      Reactions   Penicillins Other (See Comments)   Childhood reaction.      Medication List    STOP taking these medications   metroNIDAZOLE 500 MG tablet Commonly known as: FLAGYL    pantoprazole 40 MG tablet Commonly known as: Protonix   terconazole 0.4 % vaginal cream Commonly known as: TERAZOL 7   Wrist Brace Misc     TAKE these medications   acetaminophen 160 MG/5ML solution Commonly known as: TYLENOL Take 31.3 mLs (1,000 mg total) by mouth every 8 (eight) hours as needed for mild pain, moderate pain, fever or headache.   Blood Pressure Kit Devi 1 kit by Does not apply route daily.   coconut oil Oil Apply 1 application topically as needed (nipple pain).   ibuprofen 100 MG/5ML suspension Commonly known as: ADVIL Take 40 mLs (800 mg total) by mouth every 8 (eight) hours as needed for mild pain or moderate pain.   oxyCODONE 5 MG immediate release tablet Commonly known as: Oxy IR/ROXICODONE Take 1 tablet (5 mg total) by mouth every 6 (six) hours as needed for severe pain or breakthrough pain.   Vitafol Gummies 3.33-0.333-34.8 MG Chew Chew 1 tablet by mouth daily.       Discharge home in stable condition Infant Feeding: Breast Infant Disposition:home with mother Discharge instruction: per After Visit Summary and Postpartum booklet. Activity: Advance as tolerated. Pelvic rest for 6 weeks.  Diet: routine diet Future Appointments: Future Appointments  Date Time Provider [employer]  06/25/2020  8:30 AM [facility] NURSE [facility]  07/02/2020  9:15 AM [facility]  07/31/2020  9:15 AM Clarnce Flock, MD [facility]   Follow up Visit: Message sent to [facility] on 06/19/20.  Please schedule this patient for a In person postpartum visit in 6 weeks with the following provider: Any provider. Additional Postpartum F/U:Postpartum Depression checkup and Incision check 1 week  High risk pregnancy complicated by: now h/o Cesarean x3, Bipolar Disorder, Alpha thal carrier Delivery mode:  C-Section, Low Transverse  Anticipated Birth Control:  BTL done PP  [name], Gildardo Cranker, MD OB Fellow, [facility] 06/19/2020 7:41  AM

## 2020-06-17 NOTE — Anesthesia Procedure Notes (Signed)
Spinal  Patient location during procedure: OR Start time: 06/17/2020 9:25 AM End time: 06/17/2020 9:32 AM Staffing Performed: anesthesiologist  Anesthesiologist: Marcene Duos, MD Preanesthetic Checklist Completed: patient identified, IV checked, site marked, risks and benefits discussed, surgical consent, monitors and equipment checked, pre-op evaluation and timeout performed Spinal Block Patient position: sitting Prep: DuraPrep Patient monitoring: heart rate, cardiac monitor, continuous pulse ox and blood pressure Approach: midline Location: L3-4 Injection technique: single-shot Needle Needle type: Sprotte  Needle gauge: 24 G Needle length: 9 cm Assessment Sensory level: T4

## 2020-06-17 NOTE — Lactation Note (Signed)
This note was copied from a baby's chart. Lactation Consultation Note  Patient Name: Dana Hart WYOVZ'C Date: 06/17/2020 Reason for consult: Initial assessment;Term Age:36 hours   Visited with mom of a 7 hours old FT female, she's a P3 but not very experienced BF. She BF her first child for a month and her second one for 3 weeks, and the BF difficulty she reported was a difficult latch. Her children are now 69 and 30 years old.   She participated in the [facility] program at the [facility] and attended 1 BF class, she's already familiar with hand expression. When [facility] reviewed hand expression with mom she was able to get just glistening drops of colostrum; RN Tamela Oddi reported she has been able to get colostrum. Noticed that mom's tissue is challenging and only semi-compressible. LC asked for coconut oil per mom's request to help with sore nipples in the meantime, mom doesn't have enough colostrum yet. Mom has a DEBP at home.  Offered assistance with latch and mom agreed to wake baby up to feed, LC took baby STS to mother's left breast in cross cradle position but baby wouldn't open her mouth wide enough, she kept doing a lot of lip smacking and tongue sucking. Just a few sucks on and off, and mom reported some pain while baby was actively sucking, she had a very tight grip when sucking on a glove finger.  This baby is a C/S baby, informed parents that this could be part of the normal newborn behavior, but that lactation will visit again around the 24 hours mark to see if baby would benefit from the use of a NS. Reviewed normal newborn behavior, feeding cues, lactogenesis II and size of baby's stomach.   Feeding plan:  1. Encouraged mom to feed baby STS 8-12 times/24 hours or sooner if feeding cues are present.  2. Mom will use coconut oil/EBM to treat sore nipples and prior pumping 3. She'll pre-pump for 3-5 minutes prior latching baby to breast  4. Lactation will reassess around the 24 hour mark for the  need of a NS   BF brochure, BF resources and feeding diary were reviewed. Dad present and supportive. Parents reported all questions and concerns were answered, they're both aware of LC OP services and will call PRN.     Maternal Data Formula Feeding for Exclusion: No Has patient been taught Hand Expression?: Yes Does the patient have breastfeeding experience prior to this delivery?: Yes  Feeding Feeding Type: Breast Fed  LATCH Score Latch: Grasps breast easily, tongue down, lips flanged, rhythmical sucking.  Audible Swallowing: A few with stimulation  Type of Nipple: Everted at rest and after stimulation  Comfort (Breast/Nipple): Soft / non-tender  Hold (Positioning): Full assist, staff holds infant at breast  LATCH Score: 7  Interventions Interventions: Breast feeding basics reviewed;Assisted with latch;Skin to skin;Breast massage;Hand express;Breast compression;Adjust position;Support pillows;Coconut oil;Hand pump  Lactation Tools Discussed/Used Tools: Pump;Coconut oil Breast pump type: Manual WIC Program: Yes Pump Education: Setup, frequency, and cleaning Initiated by:: RN Betsy Date initiated:: 06/17/20   Consult Status Consult Status: Follow-up Date: 06/18/20 Follow-up type: In-patient    Dana Hart 06/17/2020, 6:51 PM

## 2020-06-18 ENCOUNTER — Encounter (HOSPITAL_COMMUNITY): Payer: Self-pay | Admitting: Obstetrics and Gynecology

## 2020-06-18 LAB — CBC
HCT: 24.6 % — ABNORMAL LOW (ref 36.0–46.0)
Hemoglobin: 7.5 g/dL — ABNORMAL LOW (ref 12.0–15.0)
MCH: 24.4 pg — ABNORMAL LOW (ref 26.0–34.0)
MCHC: 30.5 g/dL (ref 30.0–36.0)
MCV: 80.1 fL (ref 80.0–100.0)
Platelets: 244 10*3/uL (ref 150–400)
RBC: 3.07 MIL/uL — ABNORMAL LOW (ref 3.87–5.11)
RDW: 16.5 % — ABNORMAL HIGH (ref 11.5–15.5)
WBC: 7.2 10*3/uL (ref 4.0–10.5)
nRBC: 0.3 % — ABNORMAL HIGH (ref 0.0–0.2)

## 2020-06-18 MED ORDER — SODIUM CHLORIDE 0.9 % IV SOLN
500.0000 mg | Freq: Once | INTRAVENOUS | Status: DC
  Filled 2020-06-18: qty 25

## 2020-06-18 NOTE — Progress Notes (Signed)
Notified [name] that patient refused enoxaparin injection this morning .Also notified her that the patients hemoglobin was 7.5.

## 2020-06-18 NOTE — Lactation Note (Signed)
This note was copied from a baby's chart. Lactation Consultation Note Baby 14 hrs old.  RN called for assistance in latching. States RN assisting to latch, baby suckle once or twice then cries. Mom is requesting formula.  When LC entered room, mom holding baby swaddled, baby sleeping. Mom stated she keeps acting hungry but will not BF.  Assessed suck w/gloved finger. Baby mainly bites. Only a few times suckled on gloved finger. Mom stated that what she has been doing is biting.  Checked baby's diaper. Baby had stool. Mom stated about the 6th stool baby has had. Un-swaddled baby, placed in football position baby attempted to latch. Biting hard, noted tongue thrusting nipple out, suckled a few times on the breast but hurting and biting.  Mom stated ok lets stop. LC had checked latch, widening flange. Mom stated I think I want to give her some formula. LC explained the baby is new at trying to eat and has uncoordinated suck and will learn soon how to BF.   Mom stated she is going to do both breast and formula feed. Please bring formula for the baby. Encouraged mom to BF first before giving formula and feed STS. Reported to RN.    Patient Name: [name] Date: 06/18/2020 Reason for consult: Mother's request;Difficult latch Age:36 hours  Maternal Data    Feeding Feeding Type: Breast Fed  LATCH Score Latch: Too sleepy or reluctant, no latch achieved, no sucking elicited.  Audible Swallowing: None  Type of Nipple: Flat (semi flat)  Comfort (Breast/Nipple): Soft / non-tender  Hold (Positioning): Full assist, staff holds infant at breast  LATCH Score: 3  Interventions Interventions: Breast feeding basics reviewed;Adjust position;Assisted with latch;Support pillows;Skin to skin;Breast massage;Hand express;Pre-pump if needed;Reverse pressure;Breast compression;Hand pump  Lactation Tools Discussed/Used Tools: Pump Breast pump type: Manual   Consult  Status Consult Status: Follow-up Date: 06/18/20 Follow-up type: In-patient    Charyl Dancer 06/18/2020, 12:33 AM

## 2020-06-18 NOTE — Lactation Note (Signed)
This note was copied from a baby's chart. Lactation Consultation Note  Patient Name: Girl Kamila Broda TRZNB'V Date: 06/18/2020   Age:36 hours  Mom reports she may want assistance with breastfeeding at the next feed.  Mom attempting to order her dinner.  Infant in crib cuing.  Fed formula at at last feed. Discussed initiating pumping with DEBP and pumping everytime gives bottle.  Mom has manual pump at bedside    Maternal Data    Feeding Feeding Type: Bottle Fed - Formula Nipple Type: Slow - flow  LATCH Score                   Interventions    Lactation Tools Discussed/Used     Consult Status      Jerret Mcbane Michaelle Copas 06/18/2020, 6:53 PM

## 2020-06-18 NOTE — Clinical Social Work Maternal (Signed)
CLINICAL SOCIAL WORK MATERNAL/CHILD NOTE  Patient Details  Name: Dana Hart MRN: [id number] Date of Birth: 06/17/1985  Date:  08/18/2020  Clinical Social Worker Initiating Note:  Hortencia Pilar LCSW Date/Time: Initiated:  06/18/20/0900     Child's Name:  Dana Hart   Biological Parents:  Mother,Father (Dana Hart and Dana Hart)   Need for Interpreter:  None   Reason for Referral:  Behavioral Health Concerns   Address:  [address] [place] [address]    Phone number:  [phone] (home)     Additional phone number: none   Household Members/Support Persons (HM/SP):   Household Member/Support Person 1,Household Member/Support Person 2   HM/SP Name Relationship DOB or Age  HM/SP -1  Dana Hart 10/05/1985  HM/SP -2 Dana Hart FOB    HM/SP -3 Dana Hart son 80 years old   HM/SP -4 Dana Hart daughter  1 years old   HM/SP -5        HM/SP -6        HM/SP -7        HM/SP -8          Natural Supports (not living in the home):  Parent   Professional Supports: None   Employment:   MOB reported that she is on leave at this time.   Type of Work: on leave from [employer] per MOB's report.   Education:  Some Materials engineer arranged:  n/a  Surveyor, quantity Resources:  Banker   Other Resources:  Sales executive ,WIC   Cultural/Religious Considerations Which May Impact Care:  none reported.   Strengths:  Ability to meet basic needs ,Compliance with medical plan ,Home prepared for child ,Pediatrician chosen   Psychotropic Medications:      MOB expressed that she has med use hx but is unsure of name of her medication when asked.    Pediatrician:    [place]  Pediatrician List:   [facility] [phone] [facility].)  [place]    [facility]      Pediatrician Fax Number:    Risk Factors/Current Problems:  None   Cognitive  State:  Other (Comment) (MOB appeared to be very tired adn sleepy however MOB answered questions when asked.)   Mood/Affect:  Relaxed ,Comfortable ,Calm    CSW Assessment: CSW consulted due to MOB having a hx of depression/Bipolar. CSW went to speak with MOB at bedside to address further needs.   CSW congratulated MOB on the birth of infant. CSW advised MOB of the HIPPA policy in which MOB reported to CSW that it was her mom in the room and that she was okay with her staying in the room. CSW understanding and advised MOB of CSW's role and the reason for CSW coming to speak with her. MOB expressed that in May 2021 she was given the diagnosis of depression. anxiety and Bipolar. MOB expressed that she was placed in therapy and on medication however MOB indicated that she hasn't been taking the medications but she does she her therapist every two weeks. MOB expressed that she feels that therapy is very helpful for her and that she may began taking her medication prescribed during the Post Partum Period. CSW understanding and inquired from MOB on any other mental health diagnosis in which MOB declined having any. CSW as advised that MOB is not feeling SI, HI  nor is MOB involved in DV.   CSW inquired from MOB on who her supports are during this time in which MOB expressed that she has support from her spouse as well as her mother. MOB expressed that she has WIC and Food Stamps, however MOB indicated that she is starting over with new infant therefore she had some items but requested further referrals to be made for her. CSW understanding and will make CC4C and Healthy Start Ref. CSW also advised MOB of [facility] referrals as well. MOB reported that she has a basinet for infant to sleep in however MOB then reported that she would need something. CSW provided MOB with Baby Box for infant to sleep in once arrived home.   CSW took time to provide MOB with PPD and SIDS education. MOB was given PPD  Checklist in order to keep track of her feelings as they may relate to PPD. MOB thanked CSW and reported no other needs at this time. No barrier's to discharge.   CSW Plan/Description:  No Further Intervention Required/No Barriers to Discharge,Sudden Infant Death Syndrome (SIDS) Education,Perinatal Mood and Anxiety Disorder (PMADs) Education    Kierra S Wiley, LCSWA 06/18/2020, 9:20 AM  

## 2020-06-18 NOTE — Progress Notes (Signed)
POSTPARTUM PROGRESS NOTE  Subjective: Dana Hart is a 35 y.o. [id number] s/p rLTCS+BTL at [redacted]w[redacted]d.  She reports she doing well. No acute events overnight. She denies any problems with ambulating, voiding or po intake. Denies nausea or vomiting. She has passed flatus. Pain is moderately controlled.  Lochia is minimal.  Objective: Blood pressure 113/66, pulse 84, temperature 98 F (36.7 C), temperature source Oral, resp. rate 18, height 5\' 1"  (1.549 m), weight 76.7 kg, last menstrual period 09/18/2019, SpO2 100 %, unknown if currently breastfeeding.  Physical Exam:  General: alert, cooperative and no distress Chest: no respiratory distress Abdomen: soft, non-tender  Uterine Fundus: firm and at level of umbilicus Extremities: No calf swelling or tenderness  no LE edema  No results for input(s): HGB, HCT in the last 72 hours.  Assessment/Plan: Dana Hart is a 35 y.o. [phone] s/p rLTCS+BTL at [redacted]w[redacted]d.  Routine Postpartum Care: Doing well, pain well-controlled.  -- Continue routine care, lactation support  -- Contraception: s/p postpartum BTL -- Feeding: breast -- Anemia: POD#1 Hgb 7.5 today. Pt consented and IV iron ordered.  Dispo: Plan for discharge POD#2-3.  [redacted]w[redacted]d, MD OB Fellow, Faculty [facility] 06/18/2020 3:30 AM

## 2020-06-19 LAB — SURGICAL PATHOLOGY

## 2020-06-19 MED ORDER — IBUPROFEN 100 MG/5ML PO SUSP: 800.0000 mg | Freq: Three times a day (TID) | ORAL | 0 refills | Status: AC | PRN

## 2020-06-19 MED ORDER — COCONUT OIL OIL: 1.0000 "application " | TOPICAL_OIL | 0 refills | Status: AC | PRN

## 2020-06-19 MED ORDER — ACETAMINOPHEN 160 MG/5ML PO SOLN: 1000.0000 mg | Freq: Three times a day (TID) | ORAL | 0 refills | Status: AC | PRN

## 2020-06-19 MED ORDER — OXYCODONE HCL 5 MG PO TABS: 5.0000 mg | ORAL_TABLET | Freq: Four times a day (QID) | ORAL | 0 refills | Status: DC | PRN

## 2020-06-19 NOTE — Lactation Note (Signed)
This note was copied from a baby's chart. Lactation Consultation Note  Patient Name: Dana Hart ALPFX'T Date: 06/19/2020 Reason for consult: Follow-up assessment Age:36 hours   P3 mother whose infant is now 73 hours old.  This is a term baby at 39+0 weeks.  Mother has a short breast feeding history with her first two children.  She breast fed her first child (now 12 years old) for one month and her second child (now 88 years old) for 3 weeks.  Mother's feeding preference is breast/bottle.  After chart review, I noticed that mother has not attempted breast feeding since 06/17/20.  Questioned mother about her feeding preference and she stated she was going to try to do breast feeding and bottle feeding.  Educated her on the importance of putting baby to the breast at every feeding prior to giving formula supplementation.  Explained "supply and demand" and that her breasts need to be stimulated for milk production to occur with a full milk supply.  Mother also informed me that she may just pump and bottle feed after discharge.  Again, reinforced the importance of breast feeding and/or pumping to help initiate a good milk supply.  Mother just recently finished feeding baby with formula.  Offered to return at the next feed if she would like assistance with latching.  Mother may call for assistance.  Engorgement prevention/treatment reviewed.  Mother has a manual pump and a DEBP for home use.  She has our OP phone number for questions after discharge.  No support person present at this time.   Maternal Data    Feeding    LATCH Score                   Interventions    Lactation Tools Discussed/Used     Consult Status Consult Status: Complete Date: 06/19/20 Follow-up type: Call as needed    Matt Delpizzo R Mica Ramdass 06/19/2020, 8:25 AM

## 2020-06-19 NOTE — Progress Notes (Signed)
CSW received consult due to score 14  on Edinburgh Depression Screen. CSW also aware that MOB circled 1 to question 10, therefore CSW followed up with MOB to address further needs.     CSW spoke with MOB and MGM at bedside. CSW expressed to MOB the reason for CSW returning back to her room. MOB expressed that she scored 14 on Edinburgh due to "preparing for baby". CSW validated these feeling and expressed to MOB that this can be a challenge at times. MOB expressed that she has since been doing okay. CSW inquired from MOB on feelings of SI and MOB indicated that "I meant to circle 0. I was on medications when I took that". CSW understanding and asked for clarity to ensure that MOB was not feeling SI in which MOB expressed that she isn't. MOB asked CSW "well what would y'all do if I was?". CSW expressed to MOB the steps that CSW would take if MOB was feeling suicidal and MOB expressed that she still isn't. CSW understanding and was advised that MOB has a Psychiatrist,  Dr. A and a Therapist Rebecca Levy With [facility]. MOB expressed that she has an appointment with Dr A on January 28 or 29. MOB expressed no other needs but does report feelings of tiredness with new infant. MGM reported that she has been here to assist MOB but also expressed frustration around visitation policy. CSW apologized to MGM and MGM expressed "well its not your fault". CSW understanding and identified no other needs     Dana Hart. Dana Hart, MSW, LCSW [facility] [place] [phone]   

## 2020-06-22 ENCOUNTER — Other Ambulatory Visit: Payer: Self-pay

## 2020-06-22 ENCOUNTER — Ambulatory Visit (INDEPENDENT_AMBULATORY_CARE_PROVIDER_SITE_OTHER): Payer: 59 | Admitting: Psychology

## 2020-06-22 DIAGNOSIS — F3181 Bipolar II disorder: Secondary | ICD-10-CM | POA: Diagnosis not present

## 2020-06-22 NOTE — BH Specialist Note (Signed)
Integrated Behavioral Health via Telemedicine Visit  06/22/2020 Dana Hart [id number]  Number of Integrated Behavioral Health visits: 19 Session Start time: 10  Session End time: 1030 Total time: 30  Referring Provider: Dr. Rebbeca Paul Patient/Family location: home [facility] Provider location: [facility] All persons participating in visit: pt and provider  Types of Service: Individual psychotherapy  I connected with Dana Hart by Telephone  (Video is Surveyor, mining) and verified that I am speaking with the correct person using two identifiers.Discussed confidentiality: Yes   I discussed the limitations of telemedicine and the availability of in person appointments.  Discussed there is a possibility of technology failure and discussed alternative modes of communication if that failure occurs.  I discussed that engaging in this telemedicine visit, they consent to the provision of behavioral healthcare and the services will be billed under their insurance.  Patient and/or legal guardian expressed understanding and consented to Telemedicine visit: Yes   Presenting Concerns: Patient and/or family reports the following symptoms/concerns: Pt reported stress related to recent birth of child and managing negative self talk around being a mom.  Discussed asking for help as not a weakness and advocating.  Discussed how trauma impacts her cognitions.  Duration of problem: 6 months; Severity of problem: moderate  Patient and/or Family's Strengths/Protective Factors: Social and Emotional competence  Goals Addressed: Patient will: 1.  Reduce symptoms of: depression: denied SI; feeling overwhelmed; issues with sleep  2.  Increase knowledge and/or ability of: self-management skills: challenging negative thoughts  3.  Demonstrate ability to: Increase healthy adjustment to current life circumstances  Progress towards Goals: Ongoing  Interventions: Interventions utilized:  CBT Cognitive  Behavioral Therapy Standardized Assessments completed: Not Needed  Patient and/or Family Response: Pt engaged in treatment conversation   Assessment: Patient currently experiencingdepressive episode due to current adjustments in life. Pt has hx of bipolar.   Patient may benefit fromcontinued supportive therapy, CBTand family support.  PLAN: 1. Follow up with behavioral health clinician on :1 week virtually 2. Behavioral recommendations:continued self-care and self-compassion; normalize anxiety/depression  3. Referral(s):[facility] [employer] (In Clinic)  Royetta Asal, PhD., LMFT-A  I discussed the assessment and treatment plan with the patient and/or parent/guardian. They were provided an opportunity to ask questions and all were answered. They agreed with the plan and demonstrated an understanding of the instructions.   They were advised to call back or seek an in-person evaluation if the symptoms worsen or if the condition fails to improve as anticipated.  Royetta Asal, PhD., LMFT-A

## 2020-06-25 ENCOUNTER — Other Ambulatory Visit: Payer: Self-pay

## 2020-06-25 ENCOUNTER — Ambulatory Visit (INDEPENDENT_AMBULATORY_CARE_PROVIDER_SITE_OTHER): Payer: 59

## 2020-06-25 VITALS — BP 139/82 | HR 79

## 2020-06-25 DIAGNOSIS — Z5189 Encounter for other specified aftercare: Secondary | ICD-10-CM

## 2020-06-25 NOTE — Progress Notes (Signed)
Pt here for incision check following repeat c-section on 06/18/19. Incision is open to air; appears clean, dry, and intact with well approximated edges. Honeycomb dressing is located above incision, pt declines help removing this. Encouraged pt to work on removing this dressing so that it does not irritate healthy skin. Reviewed good wound care and s/s of infection. BP WNL today; elevated from pt's baseline, pt expresses concern. Encouraged pt to check BP with home cuff later today. Reviewed s/s of hypertension and normal BP parameters. Pt will follow up as needed and at [facility] appt on 07/31/20. Pt reports following with behavorial health for ongoing history of bipolar 2 disorder and depression. Reports some issue with latch during breastfeeding; pt agreeable to appt with lactation.   Fleet Contras RN 06/25/20

## 2020-06-25 NOTE — Progress Notes (Signed)
Patient was assessed and managed by nursing staff during this encounter. I have reviewed the chart and agree with the documentation and plan. I have also made any necessary editorial changes.  Warden Fillers, MD 06/25/2020 12:13 PM

## 2020-06-29 ENCOUNTER — Ambulatory Visit (INDEPENDENT_AMBULATORY_CARE_PROVIDER_SITE_OTHER): Payer: 59 | Admitting: Psychology

## 2020-06-29 ENCOUNTER — Other Ambulatory Visit: Payer: Self-pay

## 2020-06-29 ENCOUNTER — Telehealth: Payer: Self-pay | Admitting: Clinical

## 2020-06-29 DIAGNOSIS — F3181 Bipolar II disorder: Secondary | ICD-10-CM

## 2020-06-29 NOTE — BH Specialist Note (Signed)
Integrated Behavioral Health via Telemedicine Visit  06/29/2020 Dana Hart [id number]  Number of Integrated Behavioral Health visits: 20 Session Start time: 2  Session End time: 230 Total time: 30  Referring Provider: Dr. Rebbeca Paul Patient/Family location: home [name] Provider location: [facility] All persons participating in visit: pt and provider  Types of Service: Individual psychotherapy  I connected with Dana Hart  by Telephone and verified that I am speaking with the correct person using two identifiers.Discussed confidentiality: Yes   I discussed the limitations of telemedicine and the availability of in person appointments.  Discussed there is a possibility of technology failure and discussed alternative modes of communication if that failure occurs.  I discussed that engaging in this telemedicine visit, they consent to the provision of behavioral healthcare and the services will be billed under their insurance.  Patient and/or legal guardian expressed understanding and consented to Telemedicine visit: Yes   Presenting Concerns: Patient and/or family reports the following symptoms/concerns: Pt reported improved mood and stated she has advocated for help from her family with her new child.  Pt shared she has been challenging her cognitions related to new transitions in life and anxiety around them. Duration of problem: 6 months; Severity of problem: moderate  Patient and/or Family's Strengths/Protective Factors: Social and Emotional competence  Goals Addressed: Patient will: 1.  Reduce symptoms of: depression: denied SI; feeling overwhelmed; issues with sleep 2.  Increase knowledge and/or ability of: self-management skills: challenging negative thoughts  3.  Demonstrate ability to: Increase healthy adjustment to current life circumstances  Progress towards Goals: Ongoing  Interventions: Interventions utilized:  CBT Cognitive Behavioral Therapy Standardized  Assessments completed: Not Needed  Patient and/or Family Response: Pt engaged in treatment conversation   Assessment: Patient currently experiencingdepressive episode due to current adjustments in life. Pt has hx of bipolar.   Patient may benefit fromcontinued supportive therapy, CBTand family support.  PLAN: 1. Follow up with behavioral health clinician on :1 week virtually 2. Behavioral recommendations:continued self-care and self-compassion; normalize anxiety/depression  3. Referral(s):[facility] [employer] (In Clinic)  I discussed the assessment and treatment plan with the patient and/or parent/guardian. They were provided an opportunity to ask questions and all were answered. They agreed with the plan and demonstrated an understanding of the instructions.  They were advised to call back or seek an in-person evaluation if the symptoms worsen or if the condition fails to improve as anticipated.t  Royetta Asal, PhD., LMFT-A

## 2020-06-29 NOTE — Telephone Encounter (Signed)
Intern called Pt to reschedule appt on 07/02/20. Pt requested appt be cancelled due to already receiving therapy services.   Bea Graff (Supervisor: Hulda Marin)

## 2020-07-06 ENCOUNTER — Ambulatory Visit (INDEPENDENT_AMBULATORY_CARE_PROVIDER_SITE_OTHER): Payer: 59 | Admitting: Psychology

## 2020-07-06 DIAGNOSIS — F3181 Bipolar II disorder: Secondary | ICD-10-CM | POA: Diagnosis not present

## 2020-07-06 NOTE — BH Specialist Note (Signed)
Integrated Behavioral Health via Telemedicine Visit  07/06/2020 Dana Hart [id number]   Number of Integrated Behavioral Health visits: 21 Session Start time: 2  Session End time: 230 Total time: 30  Referring Provider: Dr. Rebbeca Paul Patient/Family location: home [facility] Provider location: [facility] All persons participating in visit: pt and provider  Types of Service: Individual psychotherapy  I connected with Dana Hart  by Telephone and verified that I am speaking with the correct person using two identifiers.Discussed confidentiality: Yes   I discussed the limitations of telemedicine and the availability of in person appointments.  Discussed there is a possibility of technology failure and discussed alternative modes of communication if that failure occurs.  I discussed that engaging in this telemedicine visit, they consent to the provision of behavioral healthcare and the services will be billed under their insurance.  Patient and/or legal guardian expressed understanding and consented to Telemedicine visit: Yes   Presenting Concerns: Patient and/or family reports the following symptoms/concerns: Pt reported that she has been working to fight her anxiety and worry.  Pt reports she challenges the thought with alternative thoughts.  Pt shared at times she finds it hard to ask for help.  Discussed roots in childhood trauma.  Duration of problem:6 months; Severity of problem:moderate  Patient and/or Family's Strengths/Protective Factors: Social and Emotional competence  Goals Addressed: Patient will: 1. Reduce symptoms of: depression: no SI reported; feeling overwhelmed; issues with sleep; stress with new baby 2. Increase knowledge and/or ability of: self-management skills: challenging negative thoughtsaround self image and  relationships  3. Demonstrate ability to: Increase healthy adjustment to current life circumstances  Progress towards  Goals: Ongoing  Interventions: Interventions utilized:CBT Cognitive Behavioral Therapy Standardized Assessments completed:Not Needed  Patient and/or Family Response:Pt engaged in treatment conversation  Assessment: Patient currently experiencingdepressive episode due to current adjustments in life. Pt has hx of bipolar.   Patient may benefit fromcontinued supportive therapy, CBTand family support.  PLAN: 1. Follow up with behavioral health clinician on :2 weeks 2. Behavioral recommendations:continued self-care and self-compassion; challenge negative thoughts  3. Referral(s):[facility] [employer] (In Clinic)  I discussed the assessment and treatment plan with the patient and/or parent/guardian. They were provided an opportunity to ask questions and all were answered. They agreed with the plan and demonstrated an understanding of the instructions.  They were advised to call back or seek an in-person evaluation if the symptoms worsen or if the condition fails to improve as anticipated.t  Royetta Asal, PhD., LMFT-A

## 2020-07-20 ENCOUNTER — Ambulatory Visit (INDEPENDENT_AMBULATORY_CARE_PROVIDER_SITE_OTHER): Payer: 59 | Admitting: Psychology

## 2020-07-20 ENCOUNTER — Other Ambulatory Visit: Payer: Self-pay

## 2020-07-20 DIAGNOSIS — F3181 Bipolar II disorder: Secondary | ICD-10-CM
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
## 2020-07-26 ENCOUNTER — Other Ambulatory Visit: Payer: Self-pay

## 2020-07-26 ENCOUNTER — Ambulatory Visit (INDEPENDENT_AMBULATORY_CARE_PROVIDER_SITE_OTHER): Payer: 59 | Admitting: Psychology

## 2020-07-26 DIAGNOSIS — F3181 Bipolar II disorder: Secondary | ICD-10-CM
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
## 2020-07-31 ENCOUNTER — Other Ambulatory Visit: Payer: Self-pay

## 2020-07-31 ENCOUNTER — Encounter: Payer: Self-pay | Admitting: Family Medicine

## 2020-07-31 ENCOUNTER — Ambulatory Visit (INDEPENDENT_AMBULATORY_CARE_PROVIDER_SITE_OTHER): Payer: 59 | Admitting: Family Medicine

## 2020-07-31 DIAGNOSIS — Z98891 History of uterine scar from previous surgery: Secondary | ICD-10-CM

## 2020-07-31 NOTE — Progress Notes (Signed)
Post Partum Visit Note  Dana Hart is a 35 y.o. [phone] female who presents for a postpartum visit. She is 6 weeks postpartum following a repeat cesarean section.  I have fully reviewed the prenatal and intrapartum course. The delivery was at 39 gestational weeks.  Anesthesia: spinal. Postpartum course has been . Baby is doing well. Baby is feeding by both breast and bottle - Gerber Gentle. Bleeding no bleeding. Bowel function is normal. Bladder function is normal. Patient is not sexually active. Contraception method is tubal ligation. Postpartum depression screening: negative.   The pregnancy intention screening data noted above was reviewed. S/p BTL.    Edinburgh Postnatal Depression Scale - 07/31/20 0941      Edinburgh Postnatal Depression Scale:  In the Past 7 Days   I have been able to laugh and see the funny side of things. 0    I have looked forward with enjoyment to things. 0    I have blamed myself unnecessarily when things went wrong. 2    I have been anxious or worried for no good reason. 2    I have felt scared or panicky for no good reason. 0    Things have been getting on top of me. 0    I have been so unhappy that I have had difficulty sleeping. 0    I have felt sad or miserable. 1    I have been so unhappy that I have been crying. 0    The thought of harming myself has occurred to me. 0    Edinburgh Postnatal Depression Scale Total 5            The following portions of the patient's history were reviewed and updated as appropriate: allergies, current medications, past family history, past medical history, past social history, past surgical history and problem list.  Review of Systems Pertinent items noted in HPI and remainder of comprehensive ROS otherwise negative.    Objective:  BP 112/74   Pulse 74   Ht 5\' 2"  (1.575 m)   Wt 133 lb 3.2 oz (60.4 kg)   LMP  (LMP Unknown)   Breastfeeding Yes Comment: pumping  BMI 24.36 kg/m    General:  alert,  cooperative and appears stated age  Lungs: Comfortable on room air  Abdomen: soft, nontender, incision well healed without erythema, induration, or exudate   Vulva:  not evaluated        Assessment:    Normal postpartum exam. Pap smear not done at today's visit.   Plan:   Essential components of care per ACOG recommendations:  1.  Mood and well being: Patient with negative depression screening today. Reviewed local resources for support. Has been seeing BH regularly and feels she is getting benefit, plans to continue.  - Patient does not use tobacco.  - hx of drug use? No    2. Infant care and feeding:  -Patient currently breastmilk feeding? Yes  -Social determinants of health (SDOH) reviewed in EPIC. No concerns  3. Sexuality, contraception and birth spacing - Patient does not want a pregnancy in the next year.  Desired family size is 3 children.  - Reviewed forms of contraception in tiered fashion. Patient is s/p BTL, counseled on risk of ectopic pregnancy.   - Discussed birth spacing of 18 months  4. Sleep and fatigue -Encouraged family/partner/community support of 4 hrs of uninterrupted sleep to help with mood and fatigue  5. Physical Recovery  - Discussed patients delivery  and complications - Patient has urinary incontinence? No - Patient is safe to resume physical and sexual activity  6.  Health Maintenance - Last pap smear done 04/14/2019 and was normal with negative HPV.  7. Chronic Disease - PCP follow up  Venora Maples, MD [facility]

## 2020-08-09 ENCOUNTER — Other Ambulatory Visit: Payer: Self-pay

## 2020-08-09 ENCOUNTER — Ambulatory Visit (INDEPENDENT_AMBULATORY_CARE_PROVIDER_SITE_OTHER): Payer: Medicaid Other | Admitting: Psychology

## 2020-08-09 DIAGNOSIS — F3181 Bipolar II disorder: Secondary | ICD-10-CM
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
## 2020-08-24 ENCOUNTER — Other Ambulatory Visit: Payer: Self-pay

## 2020-08-24 ENCOUNTER — Ambulatory Visit (INDEPENDENT_AMBULATORY_CARE_PROVIDER_SITE_OTHER): Payer: 59 | Admitting: Psychology

## 2020-08-24 DIAGNOSIS — F3181 Bipolar II disorder: Secondary | ICD-10-CM
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
## 2020-09-05 ENCOUNTER — Telehealth: Payer: Self-pay

## 2020-09-05 NOTE — Telephone Encounter (Signed)
Eileen Stanford- Case manager with [employer] calls nurse line requesting to speak with Dr. Shawnee Knapp regarding Long term disability claim.   She is requesting to speak with provider regarding current case management and additional clinical information.   Please return call to [place] at [phone].  Veronda Prude, RN

## 2020-09-05 NOTE — Telephone Encounter (Signed)
Called pt to discuss further.  Left VM

## 2020-09-07 ENCOUNTER — Other Ambulatory Visit: Payer: Self-pay

## 2020-09-07 ENCOUNTER — Ambulatory Visit (INDEPENDENT_AMBULATORY_CARE_PROVIDER_SITE_OTHER): Payer: 59 | Admitting: Psychology

## 2020-09-07 DIAGNOSIS — F3181 Bipolar II disorder: Secondary | ICD-10-CM
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
## 2020-09-11 ENCOUNTER — Telehealth: Payer: Self-pay | Admitting: Psychology

## 2020-09-11 NOTE — Telephone Encounter (Signed)
Called patient regarding paperwork received about disability.  Pt requested I did not disclose info and she preferred psychiatrist fill out form.  Will follow request per pt.

## 2020-09-21 ENCOUNTER — Ambulatory Visit: Payer: 59 | Admitting: Psychology

## 2020-09-27 ENCOUNTER — Ambulatory Visit: Payer: 59 | Admitting: Psychology

## 2020-10-03 ENCOUNTER — Ambulatory Visit (INDEPENDENT_AMBULATORY_CARE_PROVIDER_SITE_OTHER): Payer: Medicaid Other | Admitting: Psychology

## 2020-10-03 ENCOUNTER — Other Ambulatory Visit: Payer: Self-pay

## 2020-10-03 DIAGNOSIS — F3181 Bipolar II disorder: Secondary | ICD-10-CM
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
## 2020-10-19 ENCOUNTER — Ambulatory Visit: Payer: Medicaid Other | Admitting: Psychology

## 2020-11-02 ENCOUNTER — Other Ambulatory Visit: Payer: Self-pay

## 2020-11-02 ENCOUNTER — Ambulatory Visit (INDEPENDENT_AMBULATORY_CARE_PROVIDER_SITE_OTHER): Payer: 59 | Admitting: Psychology

## 2020-11-02 DIAGNOSIS — F3181 Bipolar II disorder: Secondary | ICD-10-CM | POA: Diagnosis not present
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
## 2020-11-16 ENCOUNTER — Ambulatory Visit: Payer: Medicaid Other | Admitting: Psychology

## 2020-11-23 ENCOUNTER — Telehealth: Payer: Self-pay | Admitting: Psychology

## 2020-11-23 ENCOUNTER — Ambulatory Visit: Payer: Medicaid Other | Admitting: Psychology

## 2020-11-23 NOTE — Telephone Encounter (Signed)
Pt called to move her appt due to getting stuck on work call.  When inquired pt about SI pt stated she was doing fine just stressed. Pt has access to resources. Pt reported she didn't need to talk about it.  Scheduled to see pt next week
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
## 2020-11-30 ENCOUNTER — Ambulatory Visit (INDEPENDENT_AMBULATORY_CARE_PROVIDER_SITE_OTHER): Payer: 59 | Admitting: Psychology

## 2020-11-30 ENCOUNTER — Other Ambulatory Visit: Payer: Self-pay

## 2020-11-30 DIAGNOSIS — F3181 Bipolar II disorder: Secondary | ICD-10-CM | POA: Diagnosis not present

## 2020-11-30 NOTE — Progress Notes (Signed)
Integrated Behavioral Health Follow Up In-Person Visit  MRN: [id number] Name: Dana Hart  Number of Integrated Behavioral Health Clinician visits:  29 Session Start time: 4  Session End time: 445 Total time: 45  minutes  Types of Service: Individual psychotherapy    Subjective: Dana Hart is a 35 y.o. female  Patient was referred by Dr. Rebbeca Paul for bipolar II Patient reports the following symptoms/concerns: Pt reported she struggling with current job and mistreatment of not being able to breastfeed.  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 Pt reported struggles with boundaries with her parents.    Duration of problem: over a year; Severity of problem: moderate   Objective: Mood: Euthymic and Affect: Appropriate Risk of harm to self or others: No plan to harm self or others reported    Life Context: Family and Social:supportive family; hx of childhood trauma  Life Changes: new baby   Patient and/or Family's Strengths/Protective Factors: Social and Emotional competence   Goals Addressed: Patient will:  Reduce symptoms of: depression : pt has hx of suicidal thoughts, endorsed passive SI in previous visits; none reported today; pt has had syx of feeling overwhelmed, sad, angry and stressed, worry thoughts  Increase knowledge and/or ability of: self-management skills : to be able to recognize boundaries in giving to others and create self-compassion for self; pt continues to draw boundaries to create differentiation with mother     Progress towards Goals: Ongoing   Interventions: Interventions utilized:  Supportive Counseling, processing trauma triggers and accessing family support  Standardized Assessments completed: Not Needed   Patient and/or Family Response: Pt engaged during appt    Patient Centered Plan: Patient is on the following Treatment Plan(s):  Depression management plan  Assessment: Patient currently experiencing depressive episode due to current adjustments in life. Pt has hx of bipolar.   Patient may benefit from continued supportive therapy and family systems work.   PLAN: Follow up with behavioral health clinician on : 1 month Behavioral recommendations: continued self-care and self-compassion; share challenges with supports Referral(s): Integrated [employer] (In Clinic)   Royetta Asal, PhD., LMFT-A

## 2020-12-27 ENCOUNTER — Ambulatory Visit (INDEPENDENT_AMBULATORY_CARE_PROVIDER_SITE_OTHER): Payer: 59 | Admitting: Psychology

## 2020-12-27 ENCOUNTER — Other Ambulatory Visit: Payer: Self-pay

## 2020-12-27 DIAGNOSIS — F3181 Bipolar II disorder: Secondary | ICD-10-CM | POA: Diagnosis not present

## 2020-12-28 NOTE — BH Specialist Note (Signed)
Integrated Behavioral Health Follow Up In-Person Visit  MRN: [id number] Name: Dana Hart  Number of Integrated Behavioral Health Clinician visits:  30 Session Start time: 4  Session End time: 445 Total time: 45  minutes  Types of Service: Individual psychotherapy   Subjective: Dana Hart is a 35 y.o. female  Patient was referred by Dr. Rebbeca Paul for bipolar II. Patient reports the following symptoms/concerns: Pt reported stress related to life transitions of adjusting to job and new baby. Pt shared struggles with partner ins navigating parenting with partner. Discussed past trauma impacting relationships related to family systems. Processed intentional change she has done in therapy related to trauma childhood.  Duration of problem: over a year; Severity of problem: moderate  Objective: Mood: Euthymic and Affect: Appropriate Risk of harm to self or others: No plan to harm self or others reported    Life Context: Family and Social:supportive family; hx of childhood trauma  Life Changes: new baby   Patient and/or Family's Strengths/Protective Factors: Social and Emotional competence   Goals Addressed: Patient will:  Reduce symptoms of: depression : pt has hx of suicidal thoughts, endorsed passive SI in previous visits; none reported today; pt has had syx of feeling overwhelmed, sad, angry and stressed, worry thoughts  Increase knowledge and/or ability of: self-management skills : to be able to recognize boundaries in giving to others and create self-compassion for self; pt continues to draw boundaries to create differentiation with mother     Progress towards Goals: Ongoing   Interventions: Interventions utilized:  Supportive Counseling, processing trauma triggers and accessing family support  Standardized Assessments completed: Not Needed   Patient and/or Family Response: Pt engaged during appt    Patient Centered Plan: Patient is on the following Treatment  Plan(s): Depression management plan  Assessment: Patient currently experiencing depressive episode due to current adjustments in life. Pt has hx of bipolar.   Patient may benefit from continued supportive therapy and family systems work.   PLAN: Follow up with behavioral health clinician on : 3 weeks Behavioral recommendations: continued self-care and self-compassion; share challenges with supports Referral(s): Integrated [employer] (In Clinic)   Royetta Asal, PhD., LMFT-A

## 2021-01-11 ENCOUNTER — Ambulatory Visit: Payer: Medicaid Other | Admitting: Psychology

## 2021-01-16 ENCOUNTER — Other Ambulatory Visit: Payer: Self-pay

## 2021-01-16 ENCOUNTER — Ambulatory Visit (INDEPENDENT_AMBULATORY_CARE_PROVIDER_SITE_OTHER): Payer: 59 | Admitting: Psychology

## 2021-01-16 DIAGNOSIS — F3181 Bipolar II disorder: Secondary | ICD-10-CM

## 2021-01-17 NOTE — BH Specialist Note (Signed)
Integrated Behavioral Health Follow Up In-Person Visit  MRN: [id number] Name: Dana Hart  Number of Integrated Behavioral Health Clinician visits:  31 Session Start time: 4  Session End time: 445 Total time: 45  minutes  Types of Service: Individual psychotherapy   Subjective: Dana Hart is a 35 y.o. female  Patient was referred by Dr. Rebbeca Paul for bipolar II. Patient reports the following symptoms/concerns: Pt reported continued stress with job mistreatment and balancing baby.  Discussed trauma impacting her view of self to advocate for self.   Discussed communication with husband for co-parenting. Duration of problem: over a year; Severity of problem: moderate   Objective: Mood: Euthymic and Affect: Appropriate Risk of harm to self or others: No plan to harm self or others reported    Life Context: Family and Social:supportive family; hx of childhood trauma  Life Changes: new baby   Patient and/or Family's Strengths/Protective Factors: Social and Emotional competence   Goals Addressed: Patient will:  Reduce symptoms of: depression : pt has hx of suicidal thoughts, endorsed passive SI in previous visits; denied today; pt has had syx of feeling overwhelmed, sad, angry and stressed, worry thoughts  Increase knowledge and/or ability of: self-management skills : to be able to recognize boundaries in giving to others and create self-compassion for self; pt continues to draw boundaries to create differentiation with mother     Progress towards Goals: Ongoing   Interventions: Interventions utilized:  Supportive Counseling, processing trauma triggers and accessing family support  Standardized Assessments completed: Not Needed   Patient and/or Family Response: Pt engaged during appt    Patient Centered Plan: Patient is on the following Treatment Plan(s): Depression management plan  Assessment: Patient currently experiencing depressive episode due to current  adjustments in life. Pt has hx of bipolar.   Patient may benefit from continued supportive therapy and family systems work.   PLAN: Follow up with behavioral health clinician on : 3 weeks Behavioral recommendations: continued self-care and self-compassion; share challenges with supports Referral(s): Integrated [employer] (In Clinic)   Royetta Asal, PhD., LMFT

## 2021-02-05 ENCOUNTER — Ambulatory Visit: Payer: Medicaid Other | Admitting: Psychology

## 2021-02-11 ENCOUNTER — Ambulatory Visit: Payer: 59 | Admitting: Psychology

## 2021-02-13 ENCOUNTER — Ambulatory Visit: Payer: 59 | Admitting: Psychology

## 2021-03-08 NOTE — Progress Notes (Signed)
     SUBJECTIVE:   CHIEF COMPLAINT / HPI:   Dana Hart is a 35 y.o. 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 She will follow up in 3 weeks for the next vaccines and in 5 months for the booster. Tolerated vaccine well. Safety precautions given regarding vaccine.     04/20/2021, MD PGY-3 [facility]

## 2021-03-11 ENCOUNTER — Ambulatory Visit (INDEPENDENT_AMBULATORY_CARE_PROVIDER_SITE_OTHER): Payer: 59

## 2021-03-11 ENCOUNTER — Ambulatory Visit (INDEPENDENT_AMBULATORY_CARE_PROVIDER_SITE_OTHER): Payer: 59 | Admitting: Family Medicine

## 2021-03-11 ENCOUNTER — Other Ambulatory Visit: Payer: Self-pay

## 2021-03-11 ENCOUNTER — Encounter: Payer: Self-pay | Admitting: Family Medicine

## 2021-03-11 DIAGNOSIS — Z0000 Encounter for general adult medical examination without abnormal findings: Secondary | ICD-10-CM

## 2021-03-11 DIAGNOSIS — Z23 Encounter for immunization: Secondary | ICD-10-CM | POA: Diagnosis not present

## 2021-03-11 NOTE — Addendum Note (Signed)
Addended by: Cathleen Corti on: 03/11/2021 02:07 PM   Modules accepted: Level of Service

## 2021-03-11 NOTE — Assessment & Plan Note (Signed)
Pt received first covid vaccine. She will follow up in 3 weeks for the next vaccines and in 5 months for the booster. Tolerated vaccine well. Safety precautions given regarding vaccine.

## 2021-04-01 ENCOUNTER — Other Ambulatory Visit: Payer: Self-pay

## 2021-04-01 ENCOUNTER — Ambulatory Visit (INDEPENDENT_AMBULATORY_CARE_PROVIDER_SITE_OTHER): Payer: 59

## 2021-04-01 DIAGNOSIS — Z23 Encounter for immunization: Secondary | ICD-10-CM

## 2021-05-14 DIAGNOSIS — F411 Generalized anxiety disorder: Secondary | ICD-10-CM | POA: Diagnosis not present

## 2021-05-14 DIAGNOSIS — F900 Attention-deficit hyperactivity disorder, predominantly inattentive type: Secondary | ICD-10-CM | POA: Diagnosis not present

## 2021-05-14 DIAGNOSIS — F4310 Post-traumatic stress disorder, unspecified: Secondary | ICD-10-CM | POA: Diagnosis not present

## 2021-05-14 DIAGNOSIS — F3132 Bipolar disorder, current episode depressed, moderate: Secondary | ICD-10-CM | POA: Diagnosis not present

## 2021-07-15 DIAGNOSIS — F411 Generalized anxiety disorder: Secondary | ICD-10-CM | POA: Diagnosis not present

## 2021-07-15 DIAGNOSIS — F3132 Bipolar disorder, current episode depressed, moderate: Secondary | ICD-10-CM | POA: Diagnosis not present

## 2021-07-15 DIAGNOSIS — F4310 Post-traumatic stress disorder, unspecified: Secondary | ICD-10-CM | POA: Diagnosis not present

## 2021-09-28 IMAGING — US US OB < 14 WEEKS - US OB TV
1 series · 15 of 28 positions shown · non-contrast
Comparison: None

CLINICAL DATA: Positive pregnancy test, assessment of viability,
LMP 09/18/2019

EXAM:
OBSTETRIC <14 WK US AND TRANSVAGINAL OB US
TECHNIQUE: Both transabdominal and transvaginal ultrasound examinations were
performed for complete evaluation of the gestation as well as the
maternal uterus, adnexal regions, and pelvic cul-de-sac.
Transvaginal technique was performed to assess early pregnancy.

[Series 1: us ob < 14 weeks - us ob tv · 15 of 71 slices shown]
[im 1/71]
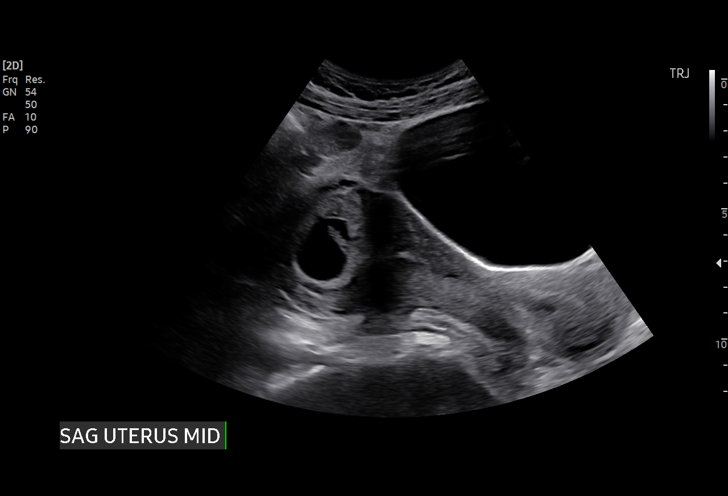
[im 6/71]
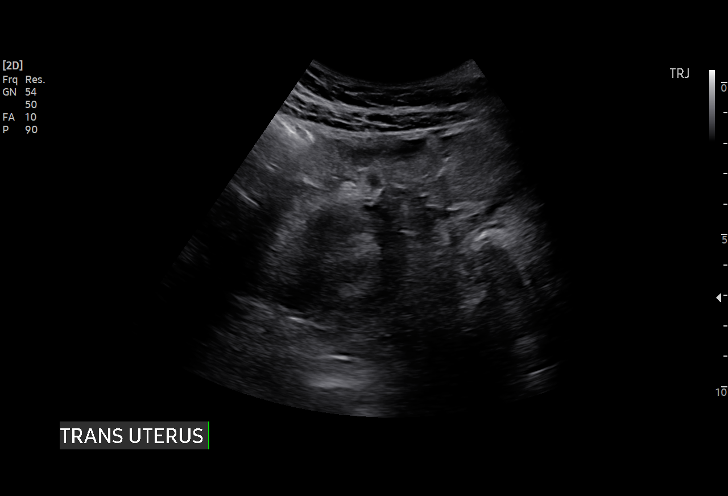
[im 11/71]
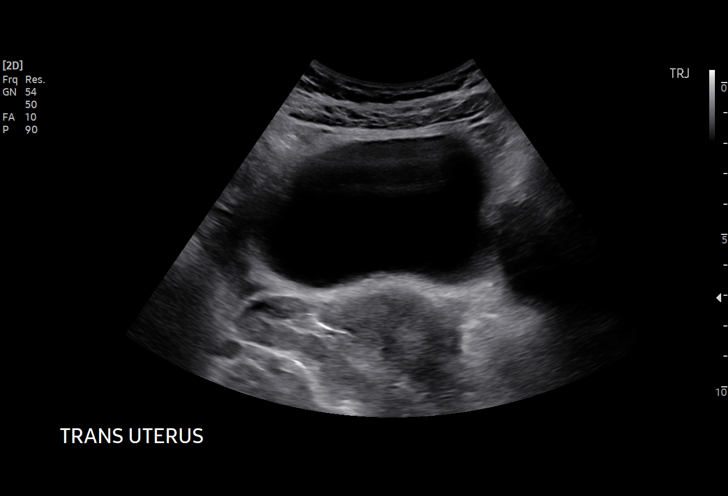
[im 16/71]
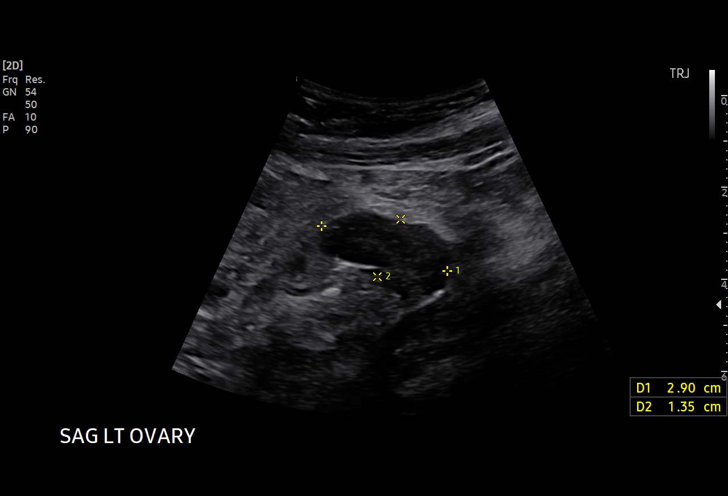
[im 21/71]
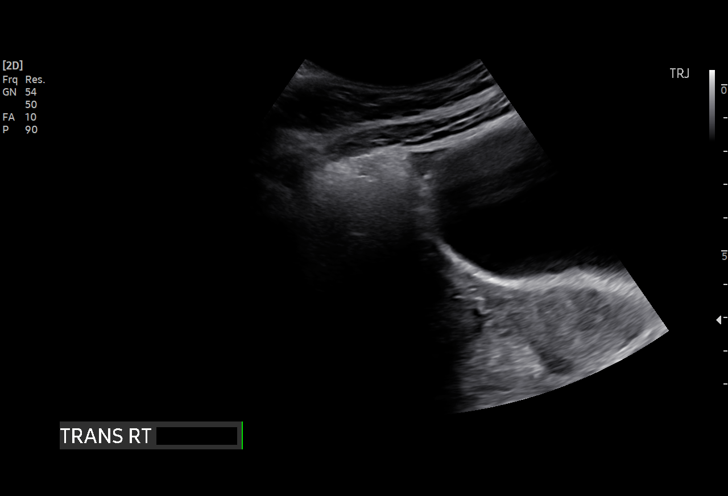
[im 26/71]
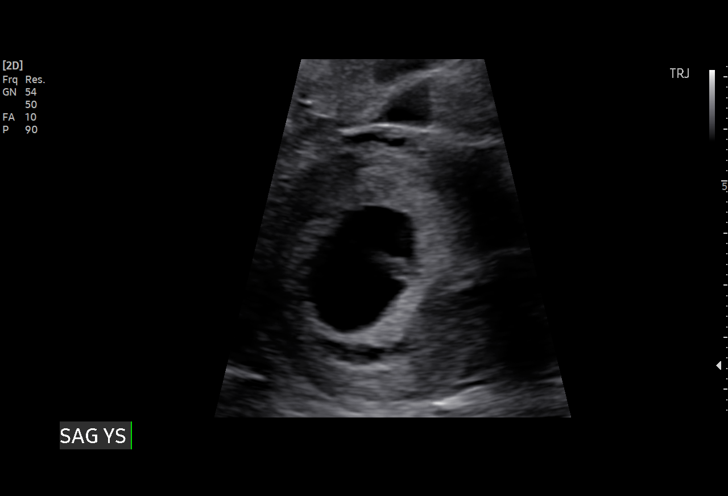
[im 32/71]
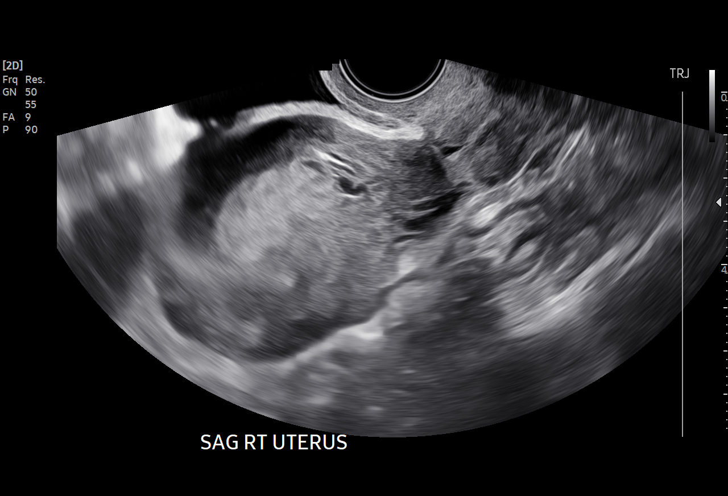
[im 37/71]
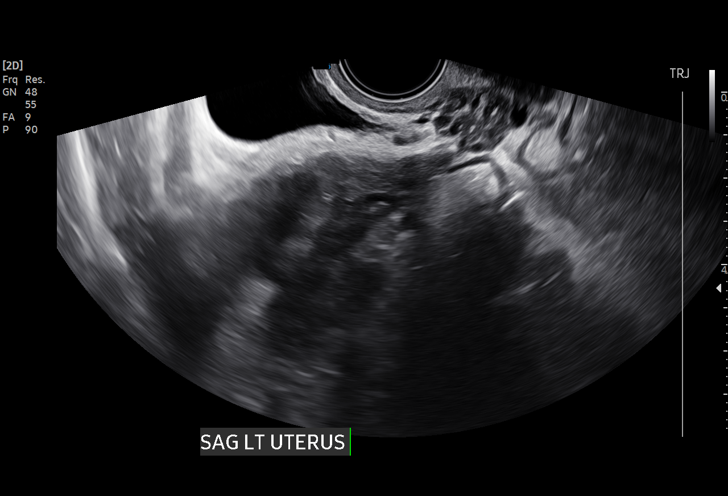
[im 39/71]
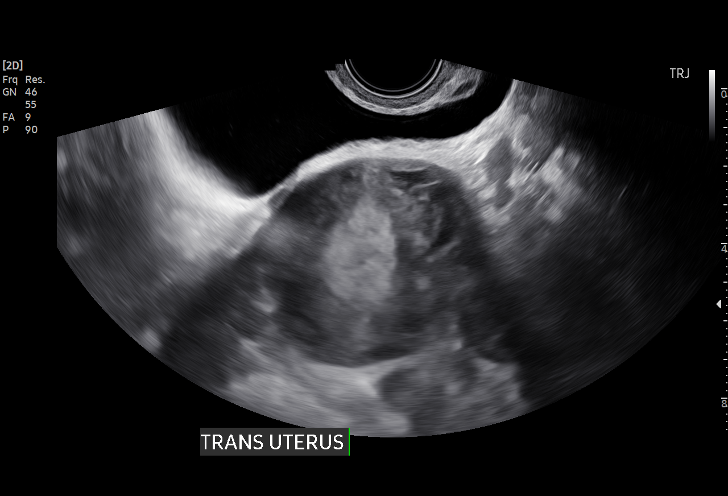
[im 45/71]
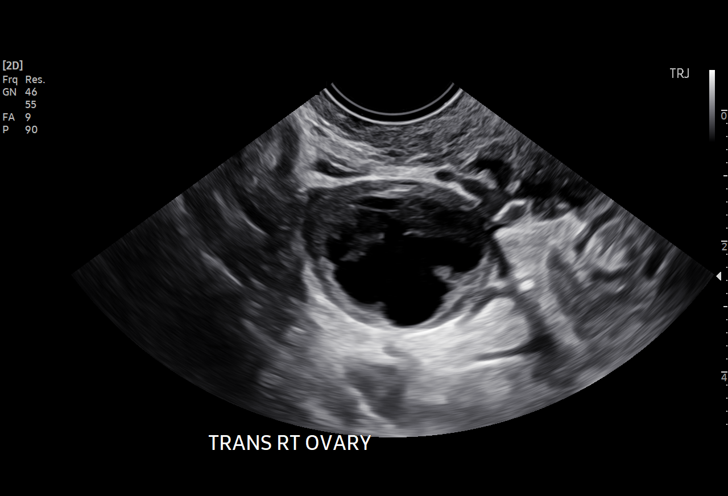
[im 50/71]
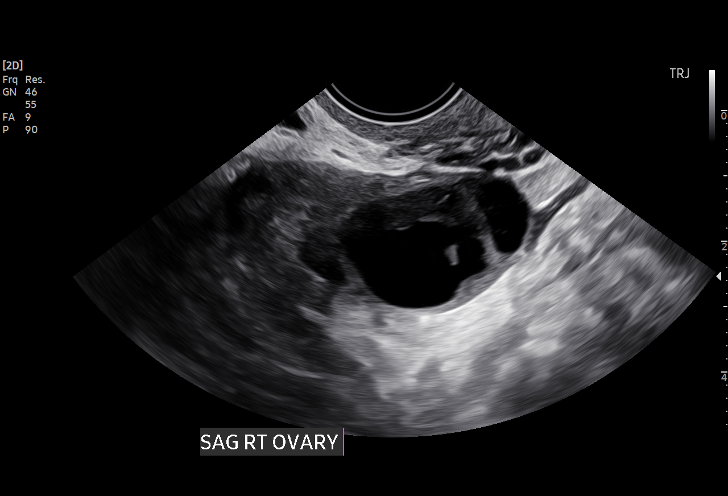
[im 55/71]
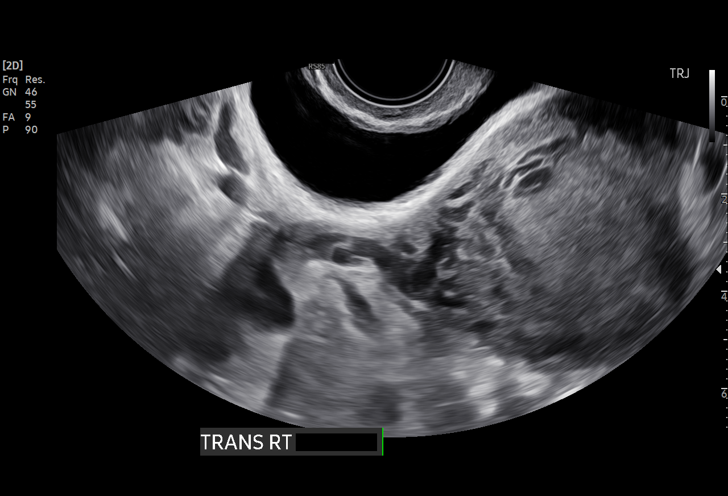
[im 60/71]
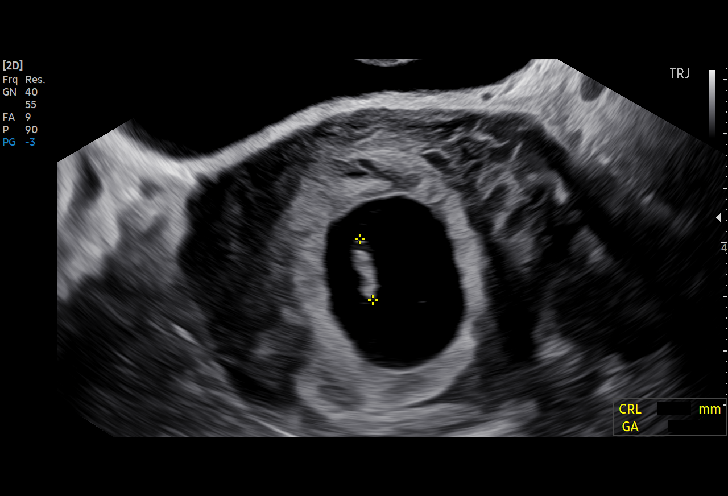
[im 65/71]
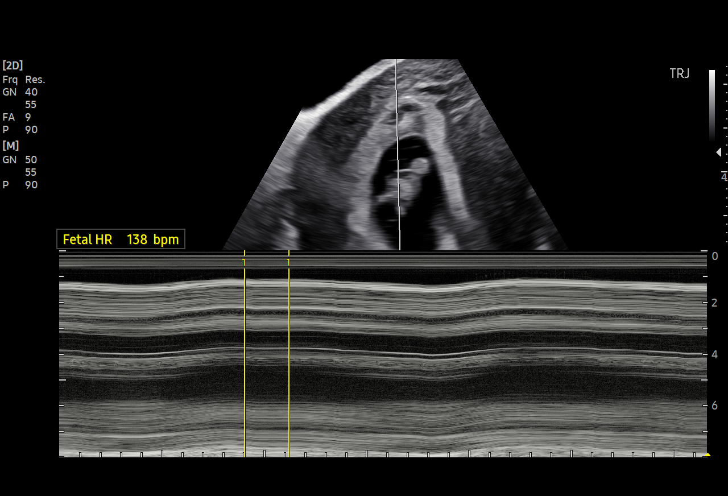
[im 71/71]
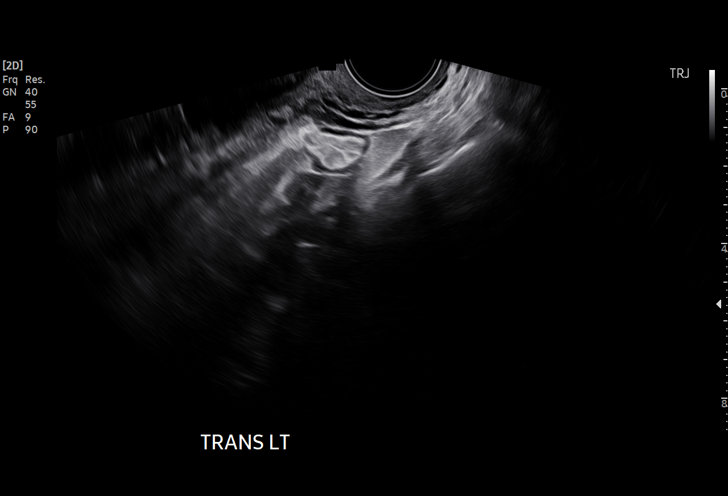

[15 of 28 positions shown; findings below may reference images not displayed]

FINDINGS: Intrauterine gestational sac: Present, single

Yolk sac:  Present

Embryo:  Present

Cardiac Activity: Present

Heart Rate: 138 bpm

CRL:  11.3 mm   7 w   2 d                  US EDC: 06/24/2020

Subchorionic hemorrhage:  None visualized.

Maternal uterus/adnexae:

Maternal uterus anteverted, otherwise unremarkable.

RIGHT ovary measures 4.4 x 1.8 x 3.0 cm and contains a 3.0 x 2.1 x
2.4 cm hemorrhagic corpus luteum.

LEFT ovary normal size and morphology 2.9 x 1.4 x 2.8 cm.

No free pelvic fluid or adnexal masses.
IMPRESSION: Single live intrauterine gestation at 7 weeks 2 days EGA.

## 2021-10-08 ENCOUNTER — Encounter: Payer: Medicaid Other | Admitting: Family Medicine

## 2021-10-14 DIAGNOSIS — F4310 Post-traumatic stress disorder, unspecified: Secondary | ICD-10-CM | POA: Diagnosis not present

## 2021-10-14 DIAGNOSIS — F411 Generalized anxiety disorder: Secondary | ICD-10-CM | POA: Diagnosis not present

## 2021-10-14 DIAGNOSIS — F3132 Bipolar disorder, current episode depressed, moderate: Secondary | ICD-10-CM | POA: Diagnosis not present

## 2021-10-27 IMAGING — US US OB LIMITED
1 series · 7 of 7 positions shown · non-contrast
Comparison: none

[Series 1: us ob limited · 7 of 7 slices shown]
[im 1/7]
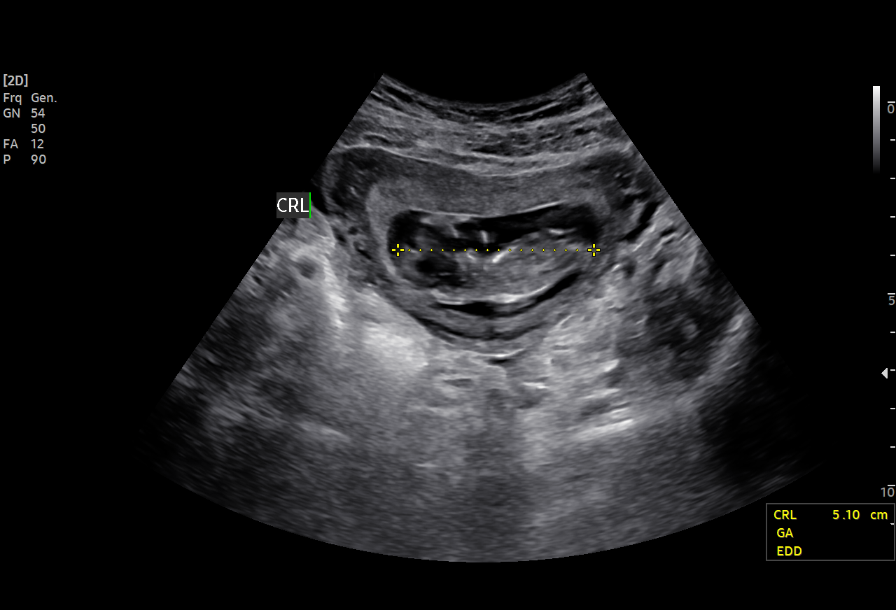
[im 2/7]
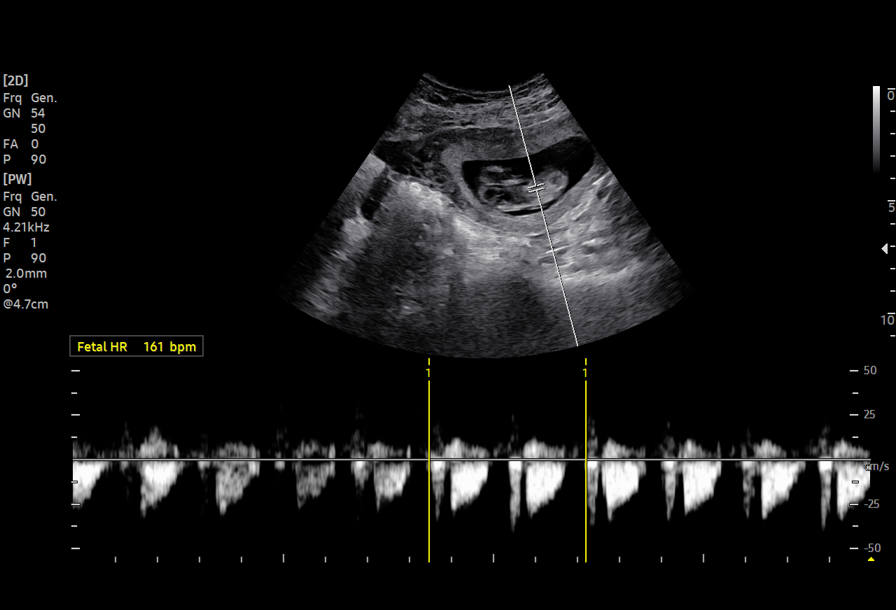
[im 3/7]
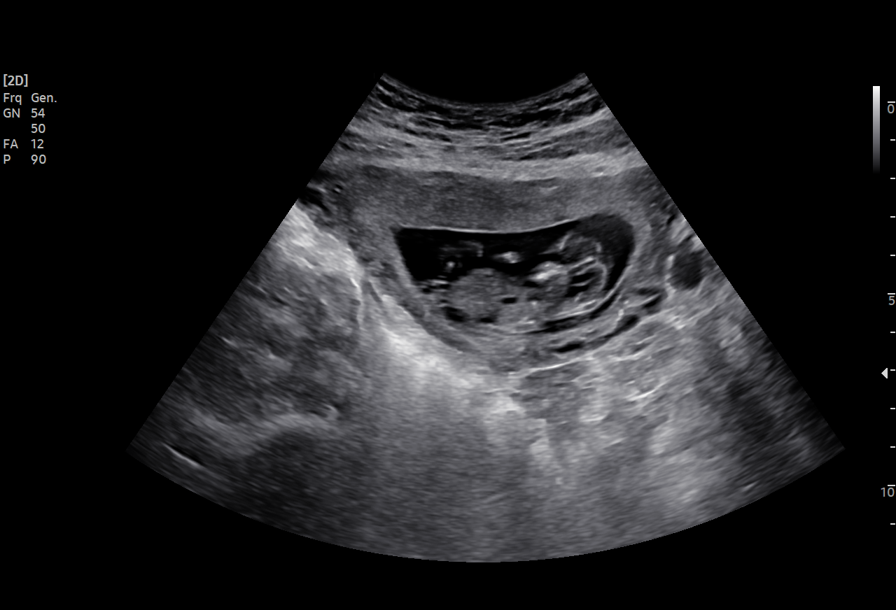
[im 4/7]
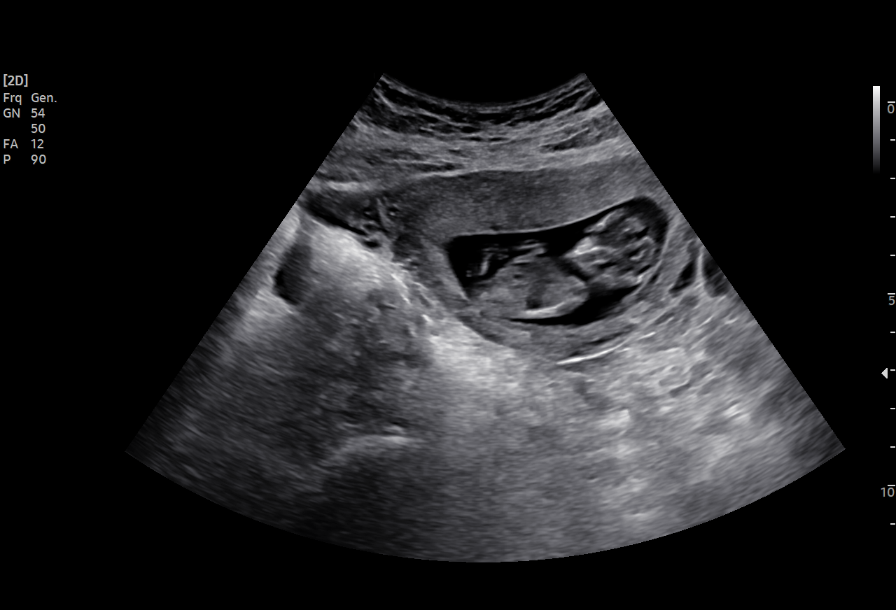
[im 5/7]
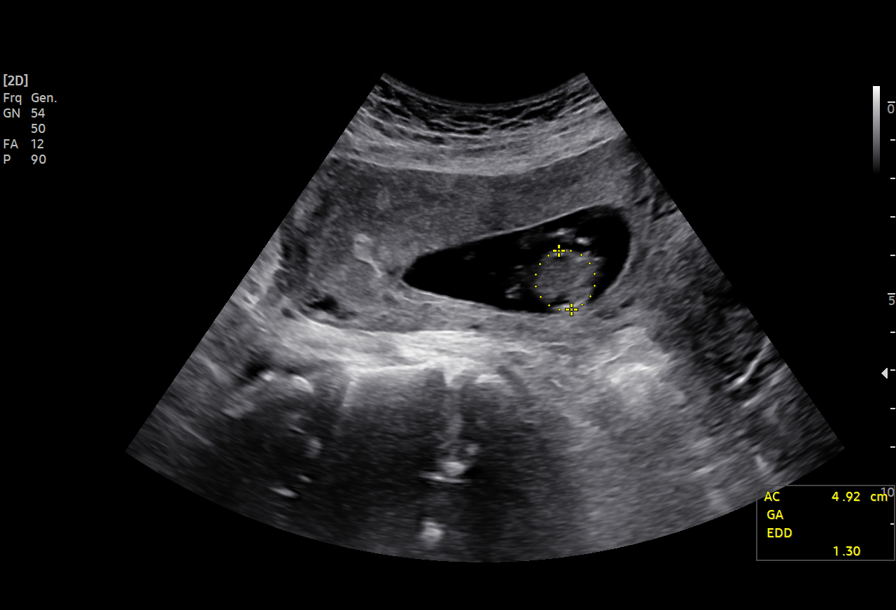
[im 6/7]
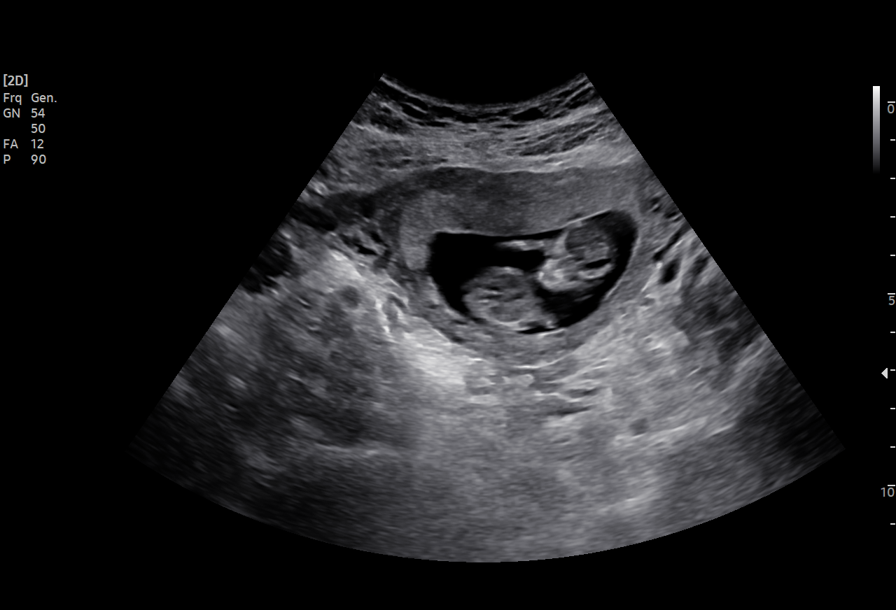
[im 7/7]
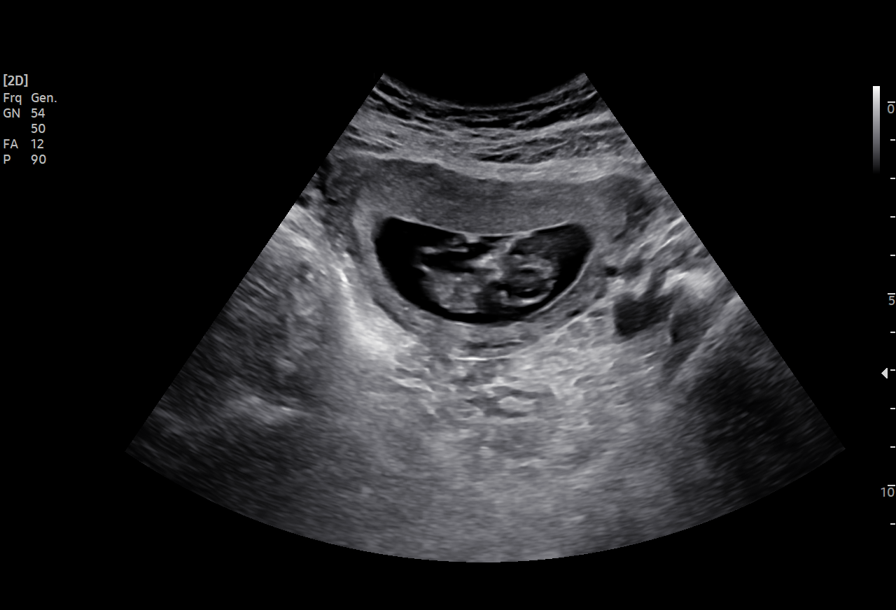

[7 of 7 positions shown; findings below may reference images not displayed]

[REDACTED]care

 1  [HOSPITAL]                         [address].0     ANGEK CHARO

Indications

 11 weeks gestation of pregnancy
 Pregnancy with inconclusive fetal viability
Fetal Evaluation

 Num Of Fetuses:         1
 Fetal Heart Rate(bpm):  161
 Cardiac Activity:       Observed
Biometry

 CRL:        51  mm     G. Age:  11w 5d                  EDD:   06/22/20
 HC:       63.8  mm     G. Age:  12w 3d                  HC/AC:
 AC:       49.2  mm     G. Age:  12w 0d
Gestational Age

 U/S Today:     12w 2d                                        EDD:   06/18/20
 Best:          11w 5d     Det. By:  U/S C R L (12/07/19)     EDD:   06/22/20
Comments

 Live Single Intrauterine Pregnancy
Impression

 Viable single intrauterine pregnancy
Recommendations

 Patient advised to start prenatal care as soon as possible.
              Alleyne, Gudrun

## 2021-11-15 ENCOUNTER — Encounter: Payer: Medicaid Other | Admitting: Family Medicine

## 2021-11-19 ENCOUNTER — Encounter: Payer: Self-pay | Admitting: *Deleted

## 2021-12-20 IMAGING — US US MFM OB DETAIL+14 WK
1 series · 13 of 28 positions shown · non-contrast
Comparison: none

[Series 1: us mfm ob detail+14 wk · 13 of 89 slices shown]
[im 4/89]
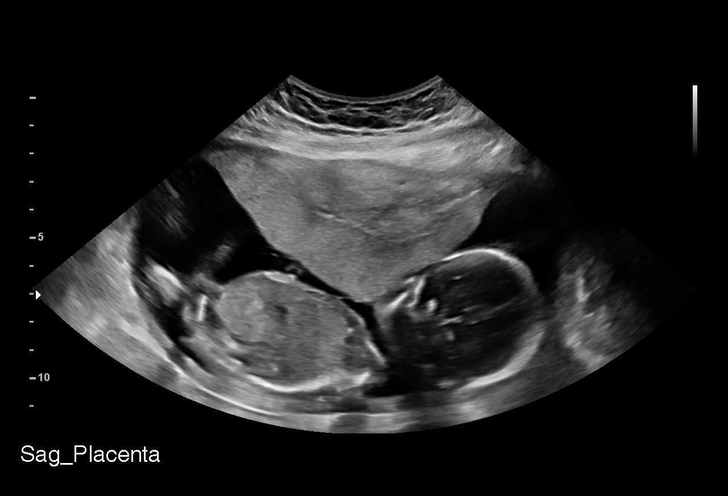
[im 10/89]
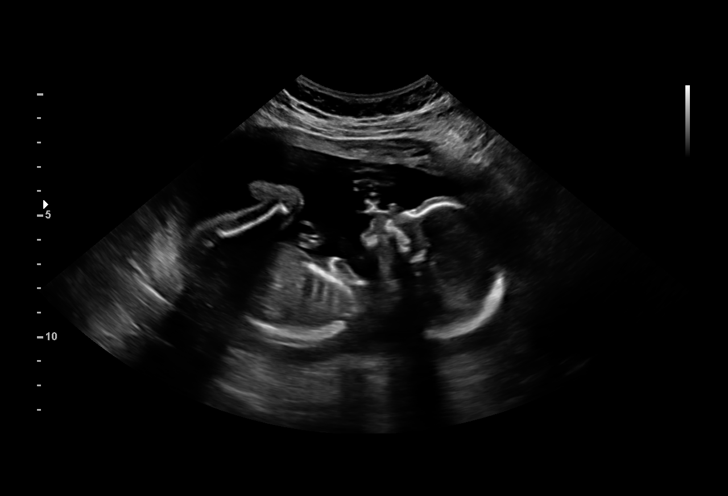
[im 17/89]
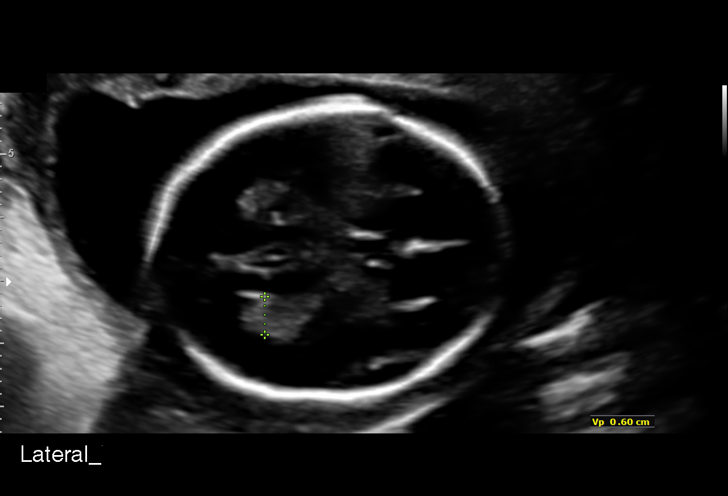
[im 23/89]
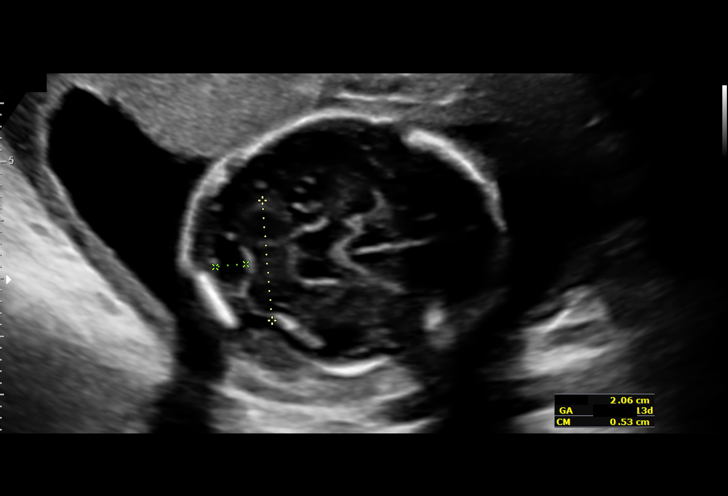
[im 30/89]
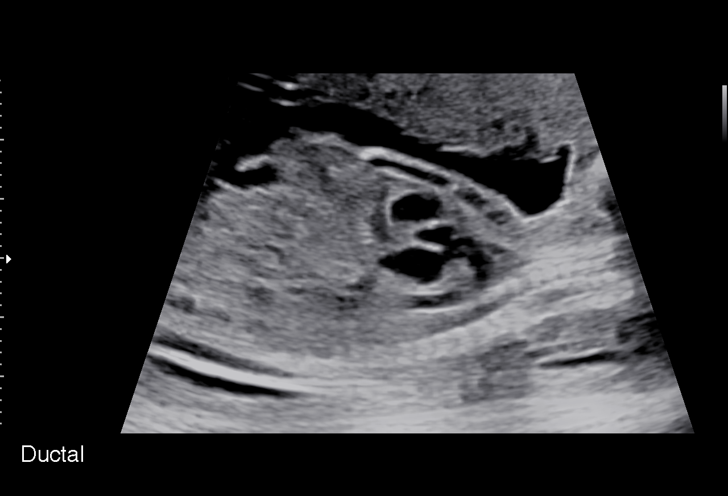
[im 36/89]
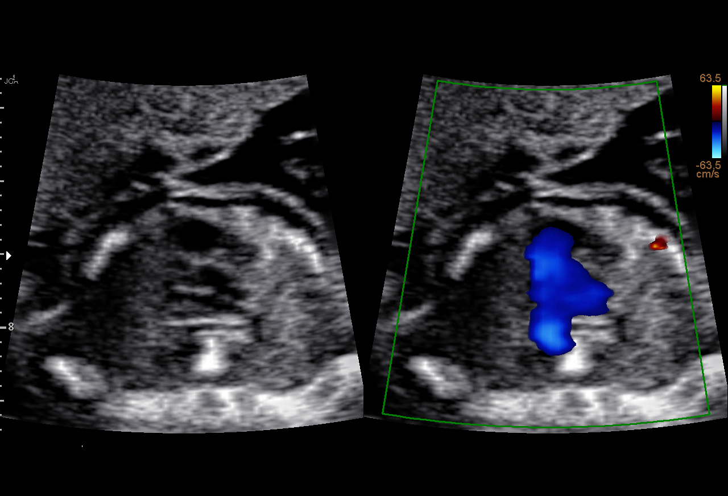
[im 46/89]
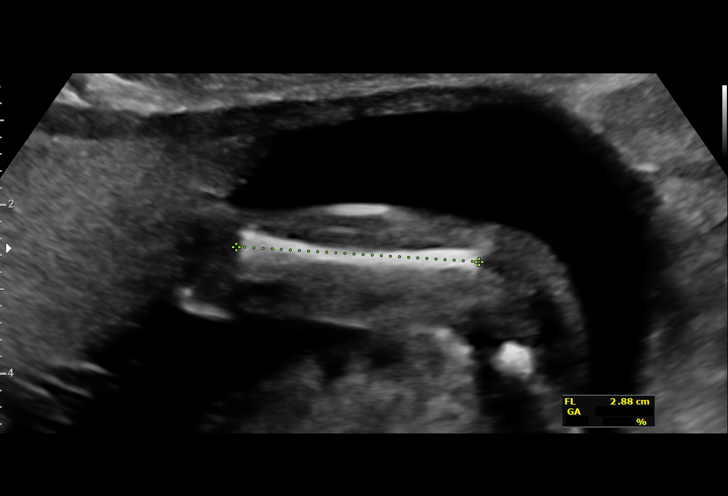
[im 53/89]
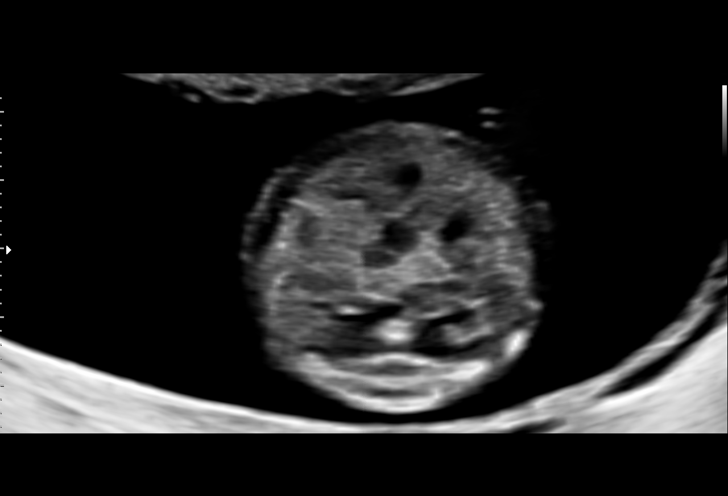
[im 59/89]
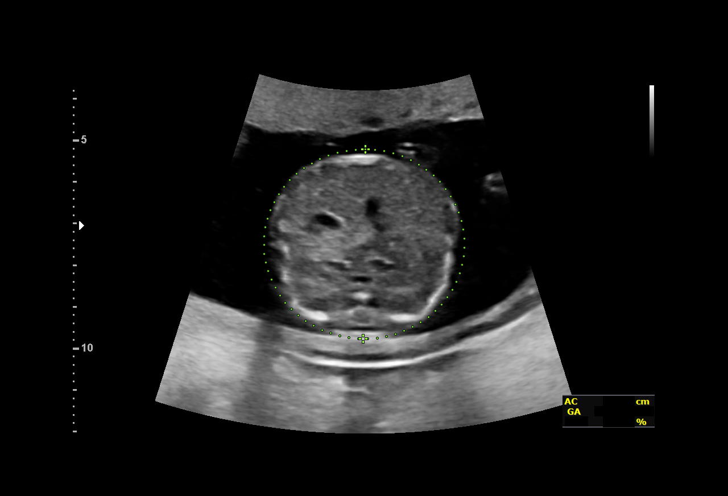
[im 66/89]
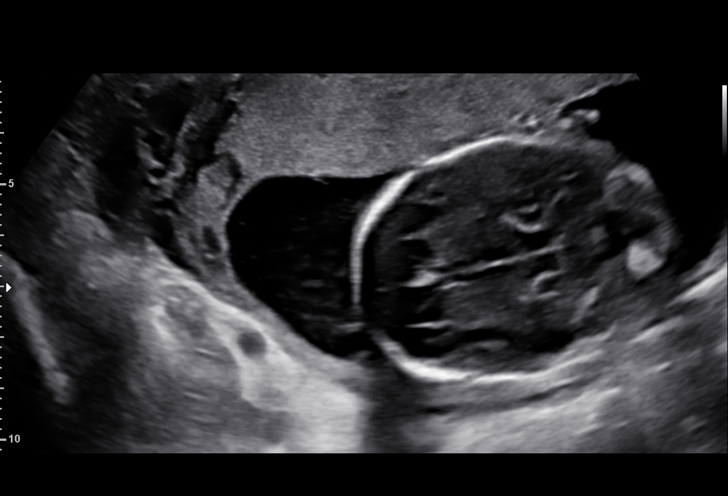
[im 72/89]
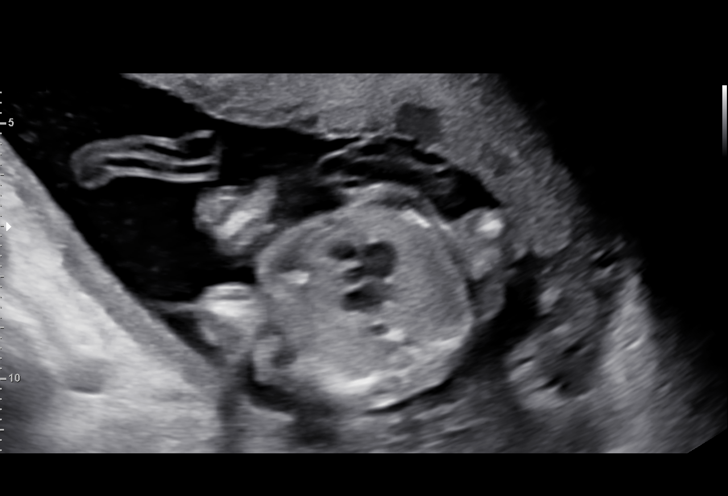
[im 79/89]
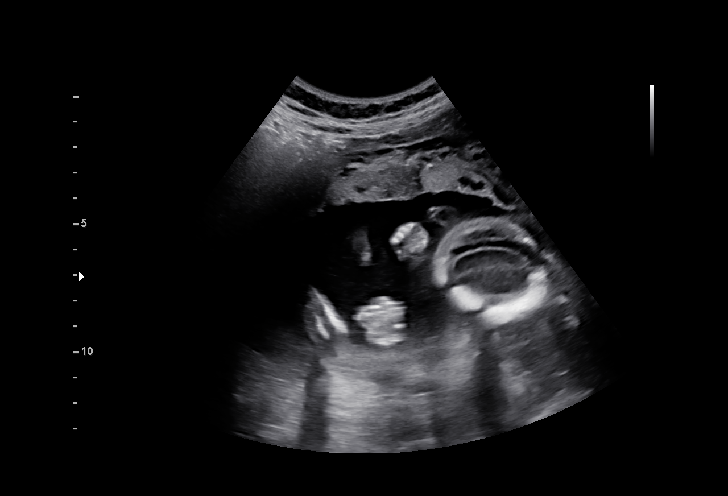
[im 85/89]
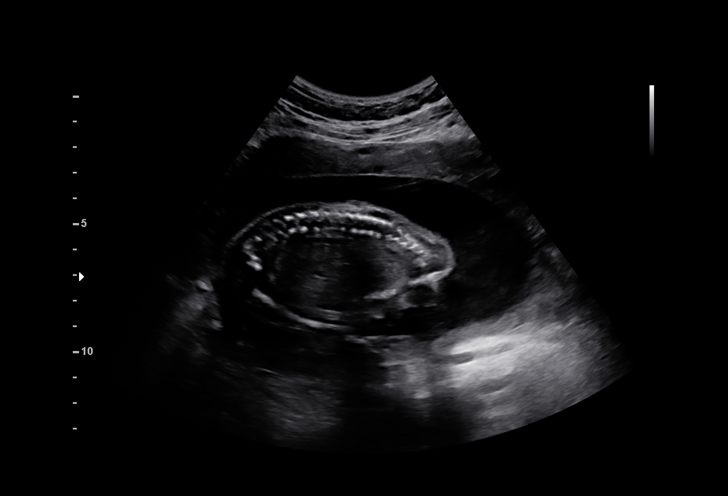

[13 of 28 positions shown; findings below may reference images not displayed]

Indications

 Advanced maternal age multigravida 35+,
 second trimester
 Encounter for antenatal screening for
 malformations
 Poor obstetrical history (hx of SGA fetus)
 History of cesarean delivery, currently
 pregnant (2x)
 Genetic carrier (Earl John Josol)
 19 weeks gestation of pregnancy
Fetal Evaluation

 Num Of Fetuses:         1
 Fetal Heart Rate(bpm):  157
 Cardiac Activity:       Observed
 Presentation:           Cephalic
 Placenta:               Anterior
 P. Cord Insertion:      Visualized

 Amniotic Fluid
 AFI FV:      Within normal limits

                             Largest Pocket(cm)

Biometry
 BPD:      46.3  mm     G. Age:  20w 0d         74  %    CI:        74.78   %    70 - 86
                                                         FL/HC:      16.9   %    16.1 -
 HC:      169.9  mm     G. Age:  19w 4d         51  %    HC/AC:      1.16        1.09 -
 AC:      146.6  mm     G. Age:  20w 0d         63  %    FL/BPD:     62.0   %
 FL:       28.7  mm     G. Age:  18w 6d         22  %    FL/AC:      19.6   %    20 - 24
 HUM:      29.4  mm     G. Age:  19w 4d         56  %
 CER:      20.6  mm     G. Age:  19w 5d         67  %
 NFT:       5.7  mm

 LV:          6  mm
 CM:        5.3  mm

 Est. FW:     294  gm    0 lb 10 oz      47  %
Gestational Age

 U/S Today:     19w 4d                                        EDD:   06/21/20
 Best:          19w 3d     Det. By:  U/S C R L  (12/07/19)    EDD:   06/22/20
Anatomy

 Cranium:               Appears normal         Aortic Arch:            Appears normal
 Cavum:                 Appears normal         Ductal Arch:            Appears normal
 Ventricles:            Appears normal         Diaphragm:              Appears normal
 Choroid Plexus:        Bilateral choroid      Stomach:                Appears normal, left
                        plexus cysts
                                                                       sided
 Cerebellum:            Appears normal         Abdomen:                Appears normal
 Posterior Fossa:       Appears normal         Abdominal Wall:         Appears nml (cord
                                                                       insert, abd wall)
 Nuchal Fold:           Appears normal         Cord Vessels:           Appears normal (3
                                                                       vessel cord)
 Face:                  Appears normal         Kidneys:                Appear normal
                        (orbits and profile)
 Lips:                  Appears normal         Bladder:                Appears normal
 Thoracic:              Appears normal         Spine:                  Not well visualized
 Heart:                 Appears normal         Upper Extremities:      Appears normal
                        (4CH, axis, and
                        situs)
 RVOT:                  Appears normal         Lower Extremities:      Appears normal
 LVOT:                  Appears normal

 Other:  Fetus appears to be female.
Cervix Uterus Adnexa

 Cervix
 Length:           4.07  cm.
 Normal appearance by transabdominal scan.
Comments

 This patient was seen for a detailed fetal anatomy scan due
 to advanced maternal age.  She has a history of 2 prior
 cesarean deliveries.
 She denies any significant past medical history and denies
 any problems in her current pregnancy.
 She reports that she had a cell free DNA test earlier in her
 pregnancy which indicated a low risk for trisomy 21, 18, and
 13. A female fetus is predicted.
 She was informed that the fetal growth and amniotic fluid
 level were appropriate for her gestational age.
 On today's exam, bilateral choroid plexus cysts were noted in
 the fetal brain.  The implications and management of choroid
 plexus cysts were discussed today.  She was advised that the
 choroid plexus cysts are most likely normal variants and will
 usually resolve at around 24 weeks.  The small association of
 bilateral choroid plexus cysts with trisomy 18 was discussed.
 She was advised that as no other anomalies were noted on
 today's ultrasound exam, it is highly unlikely that her fetus
 has trisomy 18.
 Due to the small association between choroid plexus cysts
 and trisomy 18 and due to advanced maternal age, she was
 offered and declined an amniocentesis today for definitive
 diagnosis of fetal aneuploidy.  She reports that she is
 comfortable with her negative cell free DNA test.
 The patient was informed that anomalies may be missed due
 to technical limitations. If the fetus is in a suboptimal position
 or maternal habitus is increased, visualization of the fetus in
 the maternal uterus may be impaired.
 A follow-up exam was scheduled in 4 weeks for follow-up of
 the choroid plexus cysts.

## 2022-03-04 ENCOUNTER — Encounter: Payer: Self-pay | Admitting: Family Medicine

## 2022-03-11 ENCOUNTER — Encounter: Payer: Medicaid Other | Admitting: Family Medicine

## 2022-03-18 ENCOUNTER — Encounter: Payer: Self-pay | Admitting: Family Medicine

## 2022-03-18 ENCOUNTER — Ambulatory Visit (INDEPENDENT_AMBULATORY_CARE_PROVIDER_SITE_OTHER): Payer: BC Managed Care – PPO | Admitting: Family Medicine

## 2022-03-18 VITALS — BP 117/79 | HR 91 | Wt 149.2 lb

## 2022-03-18 DIAGNOSIS — Z0000 Encounter for general adult medical examination without abnormal findings: Secondary | ICD-10-CM

## 2022-03-18 DIAGNOSIS — B353 Tinea pedis: Secondary | ICD-10-CM

## 2022-03-18 MED ORDER — TERBINAFINE HCL 250 MG PO TABS: 250.0000 mg | ORAL_TABLET | Freq: Every day | ORAL | 0 refills | Status: AC

## 2022-03-18 NOTE — Patient Instructions (Addendum)
It was great seeing you today!  You came in for your physical and Im glad you are doing well.  I have prescribed Terbinafine to take for 4 weeks. Please let us know if there is still concern for infection after using it.   Visit Reminders: - Stop by the pharmacy to pick up your prescriptions  - Continue to work on your healthy eating habits and incorporating exercise into your daily life.   Feel free to call with any questions or concerns at any time, at [phone].   Take care,  Dr. Shary Key [facility]

## 2022-03-18 NOTE — Progress Notes (Unsigned)
    SUBJECTIVE:   Chief compliant/HPI: annual examination  Dana Hart is a 36 y.o. who presents today for an annual exam.   Has concerns about fungal infection on her feet. Endorses some itching between her toes. Has a history of a fungal infection in the past and took oral Terbinafine with resolution of her infection. Requests for another prescription.  Denies tobacco use, alcohol drinks 2 cups of wine a day, walks about 15 minutes a day   No concern for STI Has bilateral tubal ligation  LMP 10/2  OBJECTIVE:   BP 117/79   Pulse 91   Wt 149 lb 3.2 oz (67.7 kg)   LMP 03/17/2022   SpO2 100%   BMI 27.29 kg/m    Physical exam General: well appearing, NAD Cardiovascular: RRR, no murmurs Lungs: CTAB. Normal WOB Abdomen: soft, non-distended, non-tender Skin: warm, dry. Scaly sloughing skin between toes (R foot worse than left). Non tender  ASSESSMENT/PLAN:   Tinea pedis Patient with scaly sloughing skin between toes (R foot worse than left). Prescribed Terbinafine 250mg  daily x 4 weeks.    Annual Examination  See AVS for age appropriate recommendations.   PHQ score 1, reviewed and discussed. Blood pressure reviewed and at goal.  The patient has bilateral tubal ligation  Reviewed risk factors for latent tuberculosis and not indicated Cervical cancer screening:  due for pap at end of the month. Patient will return for this   Follow up in 1 month for pap. Also due to Tdap. Will check on tinea pedis     [place]

## 2022-03-20 NOTE — Assessment & Plan Note (Signed)
Patient with scaly sloughing skin between toes (R foot worse than left). Prescribed Terbinafine 250mg  daily x 4 weeks.

## 2022-03-27 NOTE — Progress Notes (Deleted)
    SUBJECTIVE:   CHIEF COMPLAINT / HPI:   Dana Hart is a 36 y.o. female who presents to the [facility] clinic today to discuss the following concerns:   Pap Smear Last Pap smear 03/2019 NILM and negative for high risk HPV. No hx of abnormal Pap smears.   PERTINENT  PMH / PSH: Bipolar, depression  OBJECTIVE:   LMP 03/17/2022  ***  General: NAD, pleasant, able to participate in exam Cardiac: RRR, no murmurs. Respiratory: CTAB, normal effort, No wheezes, rales or rhonchi Abdomen: Bowel sounds present, nontender, nondistended, no hepatosplenomegaly. Extremities: no edema or cyanosis. Skin: warm and dry, no rashes noted Neuro: alert, no obvious focal deficits Psych: Normal affect and mood  ASSESSMENT/PLAN:   No problem-specific Assessment & Plan notes found for this encounter.     Sharion Settler, [place]

## 2022-04-07 ENCOUNTER — Ambulatory Visit: Payer: BC Managed Care – PPO | Admitting: Family Medicine

## 2022-04-14 NOTE — Progress Notes (Deleted)
    SUBJECTIVE:   CHIEF COMPLAINT / HPI:   Dana Hart is a 36 y.o. female who presents to the [facility] clinic today to discuss the following concerns:   Pap Smear Last Pap smear 03/2019 NILM and negative for high risk HPV. No hx of abnormal Pap smears.   PERTINENT  PMH / PSH: Bipolar, depression  OBJECTIVE:   LMP 03/17/2022  ***  General: NAD, pleasant, able to participate in exam Respiratory: normal effort GU: Normal appearance of labia majora and minora, without lesions. Vagina tissue pink, moist, without lesions or abrasions. Cervix normal appearance, non-friable, without discharge from os.  Psych: Normal affect and mood  GU exam chaperoned by *** CMA   ASSESSMENT/PLAN:   No problem-specific Assessment & Plan notes found for this encounter.     Sharion Settler, [place]

## 2022-04-16 NOTE — Patient Instructions (Incomplete)
It was wonderful to see you today.  Please bring ALL of your medications with you to every visit.   Today we talked about:  **  Thank you for coming to your visit as scheduled. We have had a large "no-show" problem lately, and this significantly limits our ability to see and care for patients. As a friendly reminder- if you cannot make your appointment please call to cancel. We do have a no show policy for those who do not cancel within 24 hours. Our policy is that if you miss or fail to cancel an appointment within 24 hours, 3 times in a 6-month period, you may be dismissed from our clinic.   Thank you for choosing [facility].   Please call [phone] with any questions about today's appointment.  Please be sure to schedule follow up at the front  desk before you leave today.   Armonee Bojanowski, DO PGY-3 Family Medicine   

## 2022-04-17 ENCOUNTER — Ambulatory Visit: Payer: BC Managed Care – PPO | Admitting: Family Medicine

## 2022-04-21 ENCOUNTER — Encounter: Payer: Self-pay | Admitting: Family Medicine

## 2022-04-21 ENCOUNTER — Ambulatory Visit (INDEPENDENT_AMBULATORY_CARE_PROVIDER_SITE_OTHER): Payer: BC Managed Care – PPO | Admitting: Family Medicine

## 2022-04-21 VITALS — BP 112/75 | HR 77 | Ht 62.0 in | Wt 147.8 lb

## 2022-04-21 DIAGNOSIS — B353 Tinea pedis: Secondary | ICD-10-CM

## 2022-04-21 MED ORDER — TERBINAFINE HCL 1 % EX CREA: 1.0000 | TOPICAL_CREAM | Freq: Two times a day (BID) | CUTANEOUS | 0 refills | Status: AC

## 2022-04-21 NOTE — Assessment & Plan Note (Signed)
She completed her 4-week course of terbinafine.  Still has ongoing concerns of hyperpigmentation of 2nd-5th digits on right foot and peeling of skin in between toes. Hyperpigmentation is likely post-inflammatory and will take time to improve. -Recommend topical terbinafine -Continue to use Vaseline to feet -F/u PRN

## 2022-04-21 NOTE — Patient Instructions (Signed)
It was wonderful to see you today.  Please bring ALL of your medications with you to every visit.   Today we talked about:  -I would recommend that you try a topical antifungal.  No need for additional oral antifungals at this point.  Continue to use the Vaseline daily.  -Often times it takes a while for hyperpigmentation to improve.   Thank you for coming to your visit as scheduled. We have had a large "no-show" problem lately, and this significantly limits our ability to see and care for patients. As a friendly reminder- if you cannot make your appointment please call to cancel. We do have a no show policy for those who do not cancel within 24 hours. Our policy is that if you miss or fail to cancel an appointment within 24 hours, 3 times in a 65-month period, you may be dismissed from our clinic.   Thank you for choosing [facility].   Please call [phone] with any questions about today's appointment.  Please be sure to schedule follow up at the front  desk before you leave today.   Sharion Settler, DO PGY-3 Family Medicine

## 2022-04-21 NOTE — Progress Notes (Signed)
    SUBJECTIVE:   CHIEF COMPLAINT / HPI:   Dana Hart is a 36 y.o. female who presents to the [facility] clinic today to discuss the following concerns:   F/u Tinea Pedis   She was recently seen on 10/3 and diagnosed with tinea pedis.  She was prescribed terbinafine 250 mg daily x4 weeks.  PERTINENT  PMH / PSH: Bipolar, depression  OBJECTIVE:   BP 112/75   Pulse 77   Ht 5\' 2"  (1.575 m)   Wt 147 lb 12.8 oz (67 kg)   LMP  (LMP Unknown)   BMI 27.03 kg/m    General: NAD, pleasant, able to participate in exam Respiratory: normal effort Skin: Hyperpigmentation of 2nd-5th digits on right foot. Hypopigmentation inbetween toes. Dry soles. No onychomycosis Psych: Normal affect and mood        ASSESSMENT/PLAN:   Tinea pedis She completed her 4-week course of terbinafine.  Still has ongoing concerns of hyperpigmentation of 2nd-5th digits on right foot and peeling of skin in between toes. Hyperpigmentation is likely post-inflammatory and will take time to improve. -Recommend topical terbinafine -Continue to use Vaseline to feet -F/u PRN     Sharion Settler, DO [place]

## 2023-09-09 NOTE — Telephone Encounter (Signed)
error 

## 2023-11-24 ENCOUNTER — Encounter: Payer: Self-pay | Admitting: *Deleted
